# Patient Record
Sex: Female | Born: 1945
Health system: Southern US, Community
[De-identification: ages and names within clinical notes are randomized; demographics above are authoritative.]

## PROBLEM LIST (undated history)

## (undated) DIAGNOSIS — R519 Headache, unspecified: Secondary | ICD-10-CM

## (undated) DIAGNOSIS — J449 Chronic obstructive pulmonary disease, unspecified: Secondary | ICD-10-CM

## (undated) DIAGNOSIS — K219 Gastro-esophageal reflux disease without esophagitis: Secondary | ICD-10-CM

## (undated) DIAGNOSIS — Z9889 Other specified postprocedural states: Secondary | ICD-10-CM

## (undated) DIAGNOSIS — I671 Cerebral aneurysm, nonruptured: Secondary | ICD-10-CM

## (undated) DIAGNOSIS — A059 Bacterial foodborne intoxication, unspecified: Secondary | ICD-10-CM

## (undated) DIAGNOSIS — M1712 Unilateral primary osteoarthritis, left knee: Secondary | ICD-10-CM

## (undated) DIAGNOSIS — I10 Essential (primary) hypertension: Secondary | ICD-10-CM

## (undated) DIAGNOSIS — K7460 Unspecified cirrhosis of liver: Secondary | ICD-10-CM

## (undated) DIAGNOSIS — E785 Hyperlipidemia, unspecified: Secondary | ICD-10-CM

## (undated) HISTORY — DX: Cerebral aneurysm, nonruptured: I67.1

## (undated) HISTORY — PX: CHOLECYSTECTOMY: SHX55

## (undated) HISTORY — DX: Other specified postprocedural states: Z98.890

## (undated) HISTORY — PX: ABDOMINAL HYSTERECTOMY: SHX81

## (undated) HISTORY — DX: Unspecified cirrhosis of liver: K74.60

## (undated) HISTORY — DX: Essential (primary) hypertension: I10

## (undated) HISTORY — DX: Hyperlipidemia, unspecified: E78.5

## (undated) HISTORY — DX: Chronic obstructive pulmonary disease, unspecified: J44.9

## (undated) HISTORY — PX: OTHER SURGICAL HISTORY: SHX169

---
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
## 1999-03-05 ENCOUNTER — Encounter (INDEPENDENT_AMBULATORY_CARE_PROVIDER_SITE_OTHER): Payer: Self-pay | Admitting: Specialist

## 1999-03-05 ENCOUNTER — Other Ambulatory Visit: Admission: RE | Admit: 1999-03-05 | Discharge: 1999-03-05 | Payer: Self-pay | Admitting: Plastic Surgery

## 2008-02-28 ENCOUNTER — Ambulatory Visit: Payer: Self-pay | Admitting: Cardiology

## 2008-02-29 ENCOUNTER — Observation Stay (HOSPITAL_COMMUNITY): Admission: AD | Admit: 2008-02-29 | Discharge: 2008-03-01 | Payer: Self-pay | Admitting: Cardiology

## 2008-02-29 ENCOUNTER — Ambulatory Visit: Payer: Self-pay | Admitting: Cardiovascular Disease

## 2010-11-11 NOTE — Discharge Summary (Signed)
Jill Braun, Jill Braun                ACCOUNT NO.:  [id number]   MEDICAL RECORD NO.:  [id number]          PATIENT TYPE:  INP   LOCATION:  4703                         FACILITY:  [facility]   PHYSICIAN:  Veverly Fells. Excell Seltzer, MD  DATE OF BIRTH:  10/23/1945   DATE OF ADMISSION:  02/29/2008  DATE OF DISCHARGE:  03/01/2008                               DISCHARGE SUMMARY   PRIMARY CARDIOLOGIST:  Learta Codding, MD,FACC at [facility].   PROCEDURES PERFORMED DURING HOSPITALIZATION:  1. Cardiac catheterization.      a.     Normal coronary anatomy and normal left ventricular function       per Dr. Tonny Bollman on February 29, 2008.   FINAL DISCHARGE DIAGNOSES:  1. Noncardiac chest pain.  2. Hypercholesterolemia.  3. Ongoing tobacco abuse.  4. History of chronic obstructive pulmonary disease.  5. Gastroesophageal reflux disease.   HOSPITAL COURSE:  This is a 65 year old Caucasian female, who was  originally seen by Dr. Andee Lineman at [facility] on consultation  secondary to chest pain.  The patient was admitted secondary to this and  has been followed by Dr. Sherril Croon.  The patient was seen and examined in  [facility] by Dr. Andee Lineman.  He fell with multiple cardiovascular risk  factors and complaints of chest discomfort that she would benefit from  cardiac catheterization.  The patient was transferred to [facility] and did undergo cardiac catheterization on February 29, 2008.  Cardiac catheterization was completed by Dr. Tonny Bollman and she was  found to be normal with normal coronary anatomy, normal LV function with  an EF of 60%.  The patient recovered well without complaints of any  further discomfort in her chest.  It was noted that she had a transient  episode of SVT lasting less than 10 seconds during the early morning  hours prior to discharge.  She was asymptomatic with this.  The patient  is not a candidate for beta-blocker therapy secondary to hypotension.   The patient was seen and  examined by Dr. Tonny Bollman on day of  discharge and the patient was found to be stable.  The patient will not  be sent home on a statin at this time per Dr. Excell Seltzer as her LDL is okay  and she had normal coronaries.  The patient had also been discussed the  importance of tobacco cessation and she verbalized understanding.  The  patient will follow up with Dr. Olga Millers in [place] in the absence of  Dr. Andee Lineman for postcath evaluation and continuation of the cardiac  monitoring with referral to pulmonologist with history of COPD at his  discussion.   DISCHARGE LABS:  Total cholesterol 160, triglycerides 156, HDL 26, LDL  103.  Cardiac enzymes were cycled in [place] and found to be negative  x3.  The patient's hemoglobin was 14.8, hematocrit 43, white blood cells  9.7, platelets 220.  Sodium 139, potassium 4.4, BUN 12, creatinine 0.8,  glucose 113.  EKG on discharge from [facility] revealed normal sinus  rhythm with no acute ST-T wave changes.   DISCHARGE  MEDICATIONS:  1. Fish oil daily.  2. Nexium 40 mg daily.  3. Multivitamin daily.  4. Calcium D 500 mg daily.  5. Unasyn p.r.n.   ALLERGIES:  No known drug allergies.   FOLLOWUP PLANS AND APPOINTMENT:  1. The patient is to follow up with Dr. Olga Millers in the absence      of Dr. Andee Lineman on Friday, March 16, 2008, at 9:30 a.m.  2. The patient is to follow up with her primary care physician Dr.      Sherril Croon on her own report for continued medical management.  3. The patient has been given postcardiac catheterization instructions      with particular emphasis on the right groin site for evidence of      bleeding, hematoma, or signs of infection.  Time spent with the      patient to include physician time 35 minutes.      Bettey Mare. Lyman Bishop, NP      Veverly Fells. Excell Seltzer, MD  Electronically Signed    KML/MEDQ  D:  03/01/2008  T:  03/02/2008  Job:  [id number]   cc:   Dr. Sherril Croon

## 2010-11-11 NOTE — Cardiovascular Report (Signed)
Jill Braun, Jill Braun                ACCOUNT NO.:  [id number]   MEDICAL RECORD NO.:  [id number]          PATIENT TYPE:  INP   LOCATION:  4703                         FACILITY:  [facility]   PHYSICIAN:  Veverly Fells. Excell Seltzer, MD  DATE OF BIRTH:  1945-12-22   DATE OF PROCEDURE:  DATE OF DISCHARGE:                            CARDIAC CATHETERIZATION   PROCEDURE:  Left heart catheterization, selective coronary angiography,  and left ventricular angiography.   INDICATIONS:  Ms. Bordenave is a 65 year old woman who was evaluated at  [facility] for chest, arm, and neck pain.  She has multiple  cardiac risk factors and in the setting of high pretest probability, was  referred for cardiac catheterization.    Risks and indications of the procedure were reviewed with the patient.  Informed consent was obtained.  The right groin was prepped, draped, and  anesthetized with 1% lidocaine using modified Seldinger technique.  A 5-  French sheath was placed in the right femoral artery.  Standard 5-French  Judkins catheters was used for coronary angiography and left  ventriculography.  All catheter exchanges were performed over guidewire.  The patient tolerated the procedure well.  There were no immediate  complications.   FINDINGS:  Aortic pressure 112/64 with a mean of 85, left ventricular  pressure 109/13.   Left mainstem:  Angiographically normal.  Trifurcates into LAD, left  circumflex, and ramus intermedius.   LAD:  Moderate-sized vessel that courses down to the LV apex and the  midportion of the LAD.  The vessel divides into a twin LAD system.  There is a high first diagonal branch that is widely patent.  There is  no significant stenosis throughout the LAD or its branch vessels.   Intermediate branch moderate-sized vessel, tortuous in the midportion,  widely patent with no stenosis.   Left circumflex single OM branch AV groove circumflex is tiny beyond the  OM.  No stenosis.   Right coronary  artery:  The RCA is dominant.  It is a large vessel.  It  supplies a small RV marginal branch, large acute marginal branch.  A  single PDA and two posterolateral branches.  There is no significant  stenosis throughout the right coronary artery or its branch vessels.   Left ventriculography:  LV function is normal.  The LVEF is estimated at  65%.  There is no mitral regurgitation.   ASSESSMENT:  1. Normal coronary arteries.  2. Normal left ventricular function.  3. Normal left ventricular filling pressure.   RECOMMENDATIONS:  Ms. Schweers symptoms are likely noncardiac.  Further  outpatient evaluation if symptoms persist.      Veverly Fells. Excell Seltzer, MD  Electronically Signed     MDC/MEDQ  D:  02/29/2008  T:  03/01/2008  Job:  [id number]   cc:   Doreen Beam, MD

## 2011-04-01 LAB — LIPID PANEL
Total CHOL/HDL Ratio: 6.2
VLDL: 31

## 2012-01-07 ENCOUNTER — Encounter: Payer: Self-pay | Admitting: Physician Assistant

## 2012-01-07 DIAGNOSIS — R079 Chest pain, unspecified: Secondary | ICD-10-CM

## 2014-01-29 ENCOUNTER — Encounter (HOSPITAL_COMMUNITY): Payer: Self-pay

## 2014-01-29 ENCOUNTER — Encounter (HOSPITAL_COMMUNITY)
Admission: RE | Admit: 2014-01-29 | Discharge: 2014-01-29 | Disposition: A | Discharge status: Home / Self Care | Payer: Medicare Other | Source: Ambulatory Visit | Attending: Ophthalmology | Admitting: Ophthalmology

## 2014-01-29 ENCOUNTER — Encounter (HOSPITAL_COMMUNITY): Payer: Self-pay | Admitting: Pharmacy Technician

## 2014-01-29 DIAGNOSIS — H2510 Age-related nuclear cataract, unspecified eye: Secondary | ICD-10-CM | POA: Diagnosis not present

## 2014-01-29 HISTORY — DX: Gastro-esophageal reflux disease without esophagitis: K21.9

## 2014-01-29 NOTE — Patient Instructions (Addendum)
Your procedure is scheduled on:  02/01/2014  Report to [facility] at  11:20    AM.  Call this number if you have problems the morning of surgery: [phone]   Remember:   Do not eat or drink :After Midnight.    Take these medicines the morning of surgery with A SIP OF WATER: Nexium   Do not wear jewelry, make-up or nail polish.  Do not wear lotions, powders, or perfumes. You may wear deodorant.  Do not shave 48 hours prior to surgery.  Do not bring valuables to the hospital.  Contacts, dentures or bridgework may not be worn into surgery.  Patients discharged the day of surgery will not be allowed to drive home.  Name and phone number of your driver:    Please read over the following fact sheets that you were given: Pain Booklet, Surgical Site Infection Prevention, Anesthesia Post-op Instructions and Care and Recovery After Surgery  Cataract Surgery  A cataract is a clouding of the lens of the eye. When a lens becomes cloudy, vision is reduced based on the degree and nature of the clouding. Surgery may be needed to improve vision. Surgery removes the cloudy lens and usually replaces it with a substitute lens (intraocular lens, IOL). LET YOUR EYE DOCTOR KNOW ABOUT:  Allergies to food or medicine.   Medicines taken including herbs, eyedrops, over-the-counter medicines, and creams.   Use of steroids (by mouth or creams).   Previous problems with anesthetics or numbing medicine.   History of bleeding problems or blood clots.   Previous surgery.   Other health problems, including diabetes and kidney problems.   Possibility of pregnancy, if this applies.  RISKS AND COMPLICATIONS  Infection.   Inflammation of the eyeball (endophthalmitis) that can spread to both eyes (sympathetic ophthalmia).   Poor wound healing.   If an IOL is inserted, it can later fall out of proper position. This is very uncommon.   Clouding of the part of your eye that holds an IOL in place. This is  called an "after-cataract." These are uncommon, but easily treated.  BEFORE THE PROCEDURE  Do not eat or drink anything except small amounts of water for 8 to 12 before your surgery, or as directed by your caregiver.   Unless you are told otherwise, continue any eyedrops you have been prescribed.   Talk to your primary caregiver about all other medicines that you take (both prescription and non-prescription). In some cases, you may need to stop or change medicines near the time of your surgery. This is most important if you are taking blood-thinning medicine.Do not stop medicines unless you are told to do so.   Arrange for someone to drive you to and from the procedure.   Do not put contact lenses in either eye on the day of your surgery.  PROCEDURE There is more than one method for safely removing a cataract. Your doctor can explain the differences and help determine which is best for you. Phacoemulsification surgery is the most common form of cataract surgery.  An injection is given behind the eye or eyedrops are given to make this a painless procedure.   A small cut (incision) is made on the edge of the clear, dome-shaped surface that covers the front of the eye (cornea).   A tiny probe is painlessly inserted into the eye. This device gives off ultrasound waves that soften and break up the cloudy center of the lens. This makes it easier for  the cloudy lens to be removed by suction.   An IOL may be implanted.   The normal lens of the eye is covered by a clear capsule. Part of that capsule is intentionally left in the eye to support the IOL.   Your surgeon may or may not use stitches to close the incision.  There are other forms of cataract surgery that require a larger incision and stiches to close the eye. This approach is taken in cases where the doctor feels that the cataract cannot be easily removed using phacoemulsification. AFTER THE PROCEDURE  When an IOL is implanted, it  does not need care. It becomes a permanent part of your eye and cannot be seen or felt.   Your doctor will schedule follow-up exams to check on your progress.   Review your other medicines with your doctor to see which can be resumed after surgery.   Use eyedrops or take medicine as prescribed by your doctor.  Document Released: 06/04/2011 Document Reviewed: 06/01/2011 [facility] Patient Information 2012 [place].  .Cataract Surgery Care After Refer to this sheet in the next few weeks. These instructions provide you with information on caring for yourself after your procedure. Your caregiver may also give you more specific instructions. Your treatment has been planned according to current medical practices, but problems sometimes occur. Call your caregiver if you have any problems or questions after your procedure.  HOME CARE INSTRUCTIONS   Avoid strenuous activities as directed by your caregiver.   Ask your caregiver when you can resume driving.   Use eyedrops or other medicines to help healing and control pressure inside your eye as directed by your caregiver.   Only take over-the-counter or prescription medicines for pain, discomfort, or fever as directed by your caregiver.   Do not to touch or rub your eyes.   You may be instructed to use a protective shield during the first few days and nights after surgery. If not, wear sunglasses to protect your eyes. This is to protect the eye from pressure or from being accidentally bumped.   Keep the area around your eye clean and dry. Avoid swimming or allowing water to hit you directly in the face while showering. Keep soap and shampoo out of your eyes.   Do not bend or lift heavy objects. Bending increases pressure in the eye. You can walk, climb stairs, and do light household chores.   Do not put a contact lens into the eye that had surgery until your caregiver says it is okay to do so.   Ask your doctor when you can return to  work. This will depend on the kind of work that you do. If you work in a dusty environment, you may be advised to wear protective eyewear for a period of time.   Ask your caregiver when it will be safe to engage in sexual activity.   Continue with your regular eye exams as directed by your caregiver.  What to expect:  It is normal to feel itching and mild discomfort for a few days after cataract surgery. Some fluid discharge is also common, and your eye may be sensitive to light and touch.   After 1 to 2 days, even moderate discomfort should disappear. In most cases, healing will take about 6 weeks.   If you received an intraocular lens (IOL), you may notice that colors are very bright or have a blue tinge. Also, if you have been in bright sunlight, everything may appear  reddish for a few hours. If you see these color tinges, it is because your lens is clear and no longer cloudy. Within a few months after receiving an IOL, these extra colors should go away. When you have healed, you will probably need new glasses.  SEEK MEDICAL CARE IF:   You have increased bruising around your eye.   You have discomfort not helped by medicine.  SEEK IMMEDIATE MEDICAL CARE IF:   You have a fever.   You have a worsening or sudden vision loss.   You have redness, swelling, or increasing pain in the eye.   You have a thick discharge from the eye that had surgery.  MAKE SURE YOU:  Understand these instructions.   Will watch your condition.   Will get help right away if you are not doing well or get worse.  Document Released: 01/02/2005 Document Revised: 06/04/2011 Document Reviewed: 02/06/2011 [facility] Patient Information 2012 [place].    Monitored Anesthesia Care  Monitored anesthesia care is an anesthesia service for a medical procedure. Anesthesia is the loss of the ability to feel pain. It is produced by medications called anesthetics. It may affect a small area of your body (local  anesthesia), a large area of your body (regional anesthesia), or your entire body (general anesthesia). The need for monitored anesthesia care depends your procedure, your condition, and the potential need for regional or general anesthesia. It is often provided during procedures where:   General anesthesia may be needed if there are complications. This is because you need special care when you are under general anesthesia.   You will be under local or regional anesthesia. This is so that you are able to have higher levels of anesthesia if needed.   You will receive calming medications (sedatives). This is especially the case if sedatives are given to put you in a semi-conscious state of relaxation (deep sedation). This is because the amount of sedative needed to produce this state can be hard to predict. Too much of a sedative can produce general anesthesia. Monitored anesthesia care is performed by one or more caregivers who have special training in all types of anesthesia. You will need to meet with these caregivers before your procedure. During this meeting, they will ask you about your medical history. They will also give you instructions to follow. (For example, you will need to stop eating and drinking before your procedure. You may also need to stop or change medications you are taking.) During your procedure, your caregivers will stay with you. They will:   Watch your condition. This includes watching you blood pressure, breathing, and level of pain.   Diagnose and treat problems that occur.   Give medications if they are needed. These may include calming medications (sedatives) and anesthetics.   Make sure you are comfortable.  Having monitored anesthesia care does not necessarily mean that you will be under anesthesia. It does mean that your caregivers will be able to manage anesthesia if you need it or if it occurs. It also means that you will be able to have a different type of  anesthesia than you are having if you need it. When your procedure is complete, your caregivers will continue to watch your condition. They will make sure any medications wear off before you are allowed to go home.  Document Released: 03/11/2005 Document Revised: 10/10/2012 Document Reviewed: 07/27/2012 [facility] Patient Information 2014 [place], [place].

## 2014-02-01 ENCOUNTER — Encounter (HOSPITAL_COMMUNITY): Payer: Self-pay | Admitting: Ophthalmology

## 2014-02-01 ENCOUNTER — Encounter (HOSPITAL_COMMUNITY)
Admission: RE | Disposition: A | Discharge status: Home / Self Care | Payer: Self-pay | Source: Ambulatory Visit | Attending: Ophthalmology

## 2014-02-01 ENCOUNTER — Encounter (HOSPITAL_COMMUNITY): Payer: Medicare Other | Admitting: Anesthesiology

## 2014-02-01 ENCOUNTER — Ambulatory Visit (HOSPITAL_COMMUNITY)
Admission: RE | Admit: 2014-02-01 | Discharge: 2014-02-01 | Disposition: A | Discharge status: Home / Self Care | Payer: Medicare Other | Source: Ambulatory Visit | Attending: Ophthalmology | Admitting: Ophthalmology

## 2014-02-01 ENCOUNTER — Ambulatory Visit (HOSPITAL_COMMUNITY): Payer: Medicare Other | Admitting: Anesthesiology

## 2014-02-01 DIAGNOSIS — H2510 Age-related nuclear cataract, unspecified eye: Secondary | ICD-10-CM | POA: Insufficient documentation

## 2014-02-01 HISTORY — PX: CATARACT EXTRACTION W/PHACO: SHX586

## 2014-02-01 SURGERY — PHACOEMULSIFICATION, CATARACT, WITH IOL INSERTION
Anesthesia: Monitor Anesthesia Care | Site: Eye | Laterality: Right

## 2014-02-01 MED ORDER — NEOMYCIN-POLYMYXIN-DEXAMETH 3.5-10000-0.1 OP SUSP
OPHTHALMIC | Status: DC | PRN
  Administered 2014-02-01: 2 [drp] via OPHTHALMIC

## 2014-02-01 MED ORDER — TETRACAINE HCL 0.5 % OP SOLN
1.0000 [drp] | OPHTHALMIC | Status: AC
  Administered 2014-02-01 (×3): 1 [drp] via OPHTHALMIC

## 2014-02-01 MED ORDER — LIDOCAINE HCL 3.5 % OP GEL
1.0000 "application " | Freq: Once | OPHTHALMIC | Status: AC
  Administered 2014-02-01: 1 via OPHTHALMIC

## 2014-02-01 MED ORDER — MIDAZOLAM HCL 2 MG/2ML IJ SOLN
INTRAMUSCULAR | Status: AC
  Filled 2014-02-01: qty 2

## 2014-02-01 MED ORDER — LIDOCAINE HCL (PF) 1 % IJ SOLN
INTRAMUSCULAR | Status: DC | PRN
  Administered 2014-02-01: .7 mL

## 2014-02-01 MED ORDER — MIDAZOLAM HCL 2 MG/2ML IJ SOLN
1.0000 mg | INTRAMUSCULAR | Status: DC | PRN
  Administered 2014-02-01: 2 mg via INTRAVENOUS

## 2014-02-01 MED ORDER — LACTATED RINGERS IV SOLN
INTRAVENOUS | Status: DC
  Administered 2014-02-01: 12:00:00 via INTRAVENOUS

## 2014-02-01 MED ORDER — FENTANYL CITRATE 0.05 MG/ML IJ SOLN
25.0000 ug | INTRAMUSCULAR | Status: AC
  Administered 2014-02-01 (×2): 25 ug via INTRAVENOUS

## 2014-02-01 MED ORDER — BSS IO SOLN
INTRAOCULAR | Status: DC | PRN
  Administered 2014-02-01: 15 mL via INTRAOCULAR

## 2014-02-01 MED ORDER — PROVISC 10 MG/ML IO SOLN
INTRAOCULAR | Status: DC | PRN
  Administered 2014-02-01: 0.85 mL via INTRAOCULAR

## 2014-02-01 MED ORDER — CYCLOPENTOLATE-PHENYLEPHRINE 0.2-1 % OP SOLN
1.0000 [drp] | OPHTHALMIC | Status: AC
  Administered 2014-02-01 (×3): 1 [drp] via OPHTHALMIC

## 2014-02-01 MED ORDER — POVIDONE-IODINE 5 % OP SOLN
OPHTHALMIC | Status: DC | PRN
  Administered 2014-02-01: 1 via OPHTHALMIC

## 2014-02-01 MED ORDER — PHENYLEPHRINE HCL 2.5 % OP SOLN
1.0000 [drp] | OPHTHALMIC | Status: AC
  Administered 2014-02-01 (×3): 1 [drp] via OPHTHALMIC

## 2014-02-01 MED ORDER — EPINEPHRINE HCL 1 MG/ML IJ SOLN
INTRAOCULAR | Status: DC | PRN
  Administered 2014-02-01: 12:00:00

## 2014-02-01 MED ORDER — ONDANSETRON HCL 4 MG/2ML IJ SOLN: 4.0000 mg | Freq: Once | INTRAMUSCULAR | Status: DC | PRN

## 2014-02-01 MED ORDER — FENTANYL CITRATE 0.05 MG/ML IJ SOLN: 25.0000 ug | INTRAMUSCULAR | Status: DC | PRN

## 2014-02-01 MED ORDER — FENTANYL CITRATE 0.05 MG/ML IJ SOLN
INTRAMUSCULAR | Status: AC
  Filled 2014-02-01: qty 2

## 2014-02-01 MED ORDER — LIDOCAINE 3.5 % OP GEL OPTIME - NO CHARGE
OPHTHALMIC | Status: DC | PRN
  Administered 2014-02-01: 2 [drp] via OPHTHALMIC

## 2014-02-01 SURGICAL SUPPLY — 34 items
CAPSULAR TENSION RING-AMO (OPHTHALMIC RELATED) IMPLANT
CLOTH BEACON ORANGE TIMEOUT ST (SAFETY) ×2 IMPLANT
EYE SHIELD UNIVERSAL CLEAR (GAUZE/BANDAGES/DRESSINGS) ×2 IMPLANT
GLOVE BIO SURGEON STRL SZ 6.5 (GLOVE) IMPLANT
GLOVE BIO SURGEONS STRL SZ 6.5 (GLOVE)
GLOVE BIOGEL PI IND STRL 6.5 (GLOVE) IMPLANT
GLOVE BIOGEL PI IND STRL 7.0 (GLOVE) IMPLANT
GLOVE BIOGEL PI IND STRL 7.5 (GLOVE) IMPLANT
GLOVE BIOGEL PI INDICATOR 6.5 (GLOVE)
GLOVE BIOGEL PI INDICATOR 7.0 (GLOVE) ×2
GLOVE BIOGEL PI INDICATOR 7.5 (GLOVE)
GLOVE ECLIPSE 6.5 STRL STRAW (GLOVE) IMPLANT
GLOVE ECLIPSE 7.0 STRL STRAW (GLOVE) IMPLANT
GLOVE ECLIPSE 7.5 STRL STRAW (GLOVE) IMPLANT
GLOVE EXAM NITRILE LRG STRL (GLOVE) IMPLANT
GLOVE EXAM NITRILE MD LF STRL (GLOVE) ×2 IMPLANT
GLOVE SKINSENSE NS SZ6.5 (GLOVE)
GLOVE SKINSENSE NS SZ7.0 (GLOVE)
GLOVE SKINSENSE STRL SZ6.5 (GLOVE) IMPLANT
GLOVE SKINSENSE STRL SZ7.0 (GLOVE) IMPLANT
KIT VITRECTOMY (OPHTHALMIC RELATED) IMPLANT
PAD ARMBOARD 7.5X6 YLW CONV (MISCELLANEOUS) ×2 IMPLANT
PROC W NO LENS (INTRAOCULAR LENS)
PROC W SPEC LENS (INTRAOCULAR LENS)
PROCESS W NO LENS (INTRAOCULAR LENS) IMPLANT
PROCESS W SPEC LENS (INTRAOCULAR LENS) IMPLANT
RETRACTOR IRIS SIGHTPATH (OPHTHALMIC RELATED) ×5 IMPLANT
RING MALYGIN (MISCELLANEOUS) IMPLANT
SIGHTPATH CAT PROC W REG LENS (Ophthalmic Related) ×3 IMPLANT
SYRINGE LUER LOK 1CC (MISCELLANEOUS) ×2 IMPLANT
TAPE SURG TRANSPORE 1 IN (GAUZE/BANDAGES/DRESSINGS) IMPLANT
TAPE SURGICAL TRANSPORE 1 IN (GAUZE/BANDAGES/DRESSINGS) ×2
VISCOELASTIC ADDITIONAL (OPHTHALMIC RELATED) IMPLANT
WATER STERILE IRR 250ML POUR (IV SOLUTION) ×2 IMPLANT

## 2014-02-01 NOTE — Transfer of Care (Signed)
Immediate Anesthesia Transfer of Care Note  Patient: Jill Braun  Procedure(s) Performed: Procedure(s) (LRB): CATARACT EXTRACTION PHACO AND INTRAOCULAR LENS PLACEMENT (IOC) (Right)  Patient Location: Shortstay  Anesthesia Type: MAC  Level of Consciousness: awake  Airway & Oxygen Therapy: Patient Spontanous Breathing   Post-op Assessment: Report given to PACU RN, Post -op Vital signs reviewed and stable and Patient moving all extremities  Post vital signs: Reviewed and stable  Complications: No apparent anesthesia complications

## 2014-02-01 NOTE — Anesthesia Preprocedure Evaluation (Signed)
Anesthesia Evaluation  Patient identified by MRN, date of birth, ID band Patient awake    Reviewed: Allergy & Precautions, H&P , NPO status , Patient's Chart, lab work & pertinent test results  Airway Mallampati: II TM Distance: >3 FB     Dental  (+) Implants   Pulmonary Current Smoker,  breath sounds clear to auscultation        Cardiovascular negative cardio ROS  Rhythm:Regular Rate:Normal     Neuro/Psych    GI/Hepatic GERD-  Controlled and Medicated,  Endo/Other    Renal/GU      Musculoskeletal   Abdominal   Peds  Hematology   Anesthesia Other Findings   Reproductive/Obstetrics                           Anesthesia Physical Anesthesia Plan  ASA: II  Anesthesia Plan: MAC   Post-op Pain Management:    Induction: Intravenous  Airway Management Planned: Nasal Cannula  Additional Equipment:   Intra-op Plan:   Post-operative Plan:   Informed Consent: I have reviewed the patients History and Physical, chart, labs and discussed the procedure including the risks, benefits and alternatives for the proposed anesthesia with the patient or authorized representative who has indicated his/her understanding and acceptance.     Plan Discussed with:   Anesthesia Plan Comments:         Anesthesia Quick Evaluation

## 2014-02-01 NOTE — Addendum Note (Signed)
Addendum created 02/01/14 1304 by Vista Deck, CRNA   Modules edited: Charges VN

## 2014-02-01 NOTE — Anesthesia Postprocedure Evaluation (Signed)
  Anesthesia Post-op Note  Patient: Jill Braun  Procedure(s) Performed: Procedure(s) (LRB): CATARACT EXTRACTION PHACO AND INTRAOCULAR LENS PLACEMENT (IOC) (Right)  Patient Location:  Short Stay  Anesthesia Type: MAC  Level of Consciousness: awake  Airway and Oxygen Therapy: Patient Spontanous Breathing  Post-op Pain: none  Post-op Assessment: Post-op Vital signs reviewed, Patient's Cardiovascular Status Stable, Respiratory Function Stable, Patent Airway, No signs of Nausea or vomiting and Pain level controlled  Post-op Vital Signs: Reviewed and stable  Complications: No apparent anesthesia complications

## 2014-02-01 NOTE — H&P (Signed)
I have reviewed the H&P, the patient was re-examined, and I have identified no interval changes in medical condition and plan of care since the history and physical of record  

## 2014-02-01 NOTE — Op Note (Signed)
Date of Admission: 02/01/2014  Date of Surgery: 02/01/2014   Pre-Op Dx: Cataract Right Eye  Post-Op Dx: Senile Nuclear  Cataract Right  Eye,  Dx Code 366.16  Surgeon: Tonny Branch, M.D.  Assistants: None  Anesthesia: Topical with MAC  Indications: Painless, progressive loss of vision with compromise of daily activities.  Surgery: Cataract Extraction with Intraocular lens Implant Right Eye  Discription: The patient had dilating drops and viscous lidocaine placed into the Right eye in the pre-op holding area. After transfer to the operating room, a time out was performed. The patient was then prepped and draped. Beginning with a 86 degree blade a paracentesis port was made at the surgeon's 2 o'clock position. The anterior chamber was then filled with 1% non-preserved lidocaine. This was followed by filling the anterior chamber with Provisc.  A 2.37mm keratome blade was used to make a clear corneal incision at the temporal limbus.  A bent cystatome needle was used to create a continuous tear capsulotomy. Hydrodissection was performed with balanced salt solution on a Fine canula. The lens nucleus was then removed using the phacoemulsification handpiece. Residual cortex was removed with the I&A handpiece. The anterior chamber and capsular bag were refilled with Provisc. A posterior chamber intraocular lens was placed into the capsular bag with it's injector. The implant was positioned with the Kuglan hook. The Provisc was then removed from the anterior chamber and capsular bag with the I&A handpiece. Stromal hydration of the main incision and paracentesis port was performed with BSS on a Fine canula. The wounds were tested for leak which was negative. The patient tolerated the procedure well. There were no operative complications. The patient was then transferred to the recovery room in stable condition.  Complications: None  Specimen: None  EBL: None  Prosthetic device: Hoya iSert 250, power 22.5 D,  SN [id number].

## 2014-02-01 NOTE — Anesthesia Procedure Notes (Signed)
Procedure Name: MAC Date/Time: 02/01/2014 12:16 PM Performed by: Vista Deck Pre-anesthesia Checklist: Patient identified, Emergency Drugs available, Suction available, Timeout performed and Patient being monitored Patient Re-evaluated:Patient Re-evaluated prior to inductionOxygen Delivery Method: Nasal Cannula

## 2014-02-02 ENCOUNTER — Encounter (HOSPITAL_COMMUNITY): Payer: Self-pay | Admitting: Ophthalmology

## 2014-02-15 ENCOUNTER — Encounter (HOSPITAL_COMMUNITY): Payer: Self-pay | Admitting: Pharmacy Technician

## 2014-02-20 ENCOUNTER — Encounter (HOSPITAL_COMMUNITY): Payer: Self-pay

## 2014-02-20 ENCOUNTER — Encounter (HOSPITAL_COMMUNITY)
Admission: RE | Admit: 2014-02-20 | Discharge: 2014-02-20 | Disposition: A | Discharge status: Home / Self Care | Payer: Medicare Other | Source: Ambulatory Visit | Attending: Ophthalmology | Admitting: Ophthalmology

## 2014-02-20 NOTE — Pre-Procedure Instructions (Signed)
History verified and no changes noted since previous assessment.  Advised she may take Nexium that am with small sip of water.  Verbalized understanding of instructions, along with date and time of arrival.

## 2014-02-21 MED ORDER — LIDOCAINE HCL (PF) 1 % IJ SOLN
INTRAMUSCULAR | Status: AC
  Filled 2014-02-21: qty 2

## 2014-02-21 MED ORDER — NEOMYCIN-POLYMYXIN-DEXAMETH 3.5-10000-0.1 OP SUSP
OPHTHALMIC | Status: AC
  Filled 2014-02-21: qty 5

## 2014-02-21 MED ORDER — LIDOCAINE HCL 3.5 % OP GEL
OPHTHALMIC | Status: AC
  Filled 2014-02-21: qty 1

## 2014-02-21 MED ORDER — TETRACAINE HCL 0.5 % OP SOLN
OPHTHALMIC | Status: AC
  Filled 2014-02-21: qty 2

## 2014-02-21 MED ORDER — CYCLOPENTOLATE-PHENYLEPHRINE OP SOLN OPTIME - NO CHARGE
OPHTHALMIC | Status: AC
  Filled 2014-02-21: qty 2

## 2014-02-21 MED ORDER — PHENYLEPHRINE HCL 2.5 % OP SOLN
OPHTHALMIC | Status: AC
  Filled 2014-02-21: qty 15

## 2014-02-22 ENCOUNTER — Encounter (HOSPITAL_COMMUNITY)
Admission: RE | Disposition: A | Discharge status: Home / Self Care | Payer: Self-pay | Source: Ambulatory Visit | Attending: Ophthalmology

## 2014-02-22 ENCOUNTER — Ambulatory Visit (HOSPITAL_COMMUNITY)
Admission: RE | Admit: 2014-02-22 | Discharge: 2014-02-22 | Disposition: A | Discharge status: Home / Self Care | Payer: Medicare Other | Source: Ambulatory Visit | Attending: Ophthalmology | Admitting: Ophthalmology

## 2014-02-22 ENCOUNTER — Ambulatory Visit (HOSPITAL_COMMUNITY): Payer: Medicare Other | Admitting: Anesthesiology

## 2014-02-22 ENCOUNTER — Encounter (HOSPITAL_COMMUNITY): Payer: Medicare Other | Admitting: Anesthesiology

## 2014-02-22 ENCOUNTER — Encounter (HOSPITAL_COMMUNITY): Payer: Self-pay | Admitting: *Deleted

## 2014-02-22 DIAGNOSIS — H2510 Age-related nuclear cataract, unspecified eye: Secondary | ICD-10-CM | POA: Diagnosis present

## 2014-02-22 DIAGNOSIS — F17200 Nicotine dependence, unspecified, uncomplicated: Secondary | ICD-10-CM | POA: Insufficient documentation

## 2014-02-22 DIAGNOSIS — K219 Gastro-esophageal reflux disease without esophagitis: Secondary | ICD-10-CM | POA: Diagnosis not present

## 2014-02-22 DIAGNOSIS — Z79899 Other long term (current) drug therapy: Secondary | ICD-10-CM | POA: Insufficient documentation

## 2014-02-22 HISTORY — PX: CATARACT EXTRACTION W/PHACO: SHX586

## 2014-02-22 SURGERY — PHACOEMULSIFICATION, CATARACT, WITH IOL INSERTION
Anesthesia: Monitor Anesthesia Care | Site: Eye | Laterality: Left

## 2014-02-22 MED ORDER — PHENYLEPHRINE HCL 2.5 % OP SOLN
1.0000 [drp] | OPHTHALMIC | Status: AC
  Administered 2014-02-22 (×3): 1 [drp] via OPHTHALMIC

## 2014-02-22 MED ORDER — MIDAZOLAM HCL 2 MG/2ML IJ SOLN
INTRAMUSCULAR | Status: AC
  Filled 2014-02-22: qty 2

## 2014-02-22 MED ORDER — LIDOCAINE HCL (PF) 1 % IJ SOLN
INTRAMUSCULAR | Status: DC | PRN
  Administered 2014-02-22: .4 mL

## 2014-02-22 MED ORDER — FENTANYL CITRATE 0.05 MG/ML IJ SOLN
25.0000 ug | INTRAMUSCULAR | Status: AC
  Administered 2014-02-22 (×2): 25 ug via INTRAVENOUS

## 2014-02-22 MED ORDER — BSS IO SOLN
INTRAOCULAR | Status: DC | PRN
  Administered 2014-02-22: 15 mL

## 2014-02-22 MED ORDER — EPINEPHRINE HCL 1 MG/ML IJ SOLN
INTRAMUSCULAR | Status: AC
  Filled 2014-02-22: qty 1

## 2014-02-22 MED ORDER — CYCLOPENTOLATE-PHENYLEPHRINE 0.2-1 % OP SOLN
1.0000 [drp] | OPHTHALMIC | Status: AC
  Administered 2014-02-22 (×3): 1 [drp] via OPHTHALMIC

## 2014-02-22 MED ORDER — LIDOCAINE HCL 3.5 % OP GEL
1.0000 "application " | Freq: Once | OPHTHALMIC | Status: AC
  Administered 2014-02-22: 1 via OPHTHALMIC

## 2014-02-22 MED ORDER — MIDAZOLAM HCL 2 MG/2ML IJ SOLN
1.0000 mg | INTRAMUSCULAR | Status: DC | PRN
  Administered 2014-02-22: 2 mg via INTRAVENOUS

## 2014-02-22 MED ORDER — PROVISC 10 MG/ML IO SOLN
INTRAOCULAR | Status: DC | PRN
  Administered 2014-02-22: 0.85 mL via INTRAOCULAR

## 2014-02-22 MED ORDER — FENTANYL CITRATE 0.05 MG/ML IJ SOLN
INTRAMUSCULAR | Status: AC
  Filled 2014-02-22: qty 2

## 2014-02-22 MED ORDER — LIDOCAINE 3.5 % OP GEL OPTIME - NO CHARGE
OPHTHALMIC | Status: DC | PRN
  Administered 2014-02-22: 1 [drp] via OPHTHALMIC

## 2014-02-22 MED ORDER — NEOMYCIN-POLYMYXIN-DEXAMETH 3.5-10000-0.1 OP SUSP
OPHTHALMIC | Status: DC | PRN
  Administered 2014-02-22: 2 [drp] via OPHTHALMIC

## 2014-02-22 MED ORDER — EPINEPHRINE HCL 1 MG/ML IJ SOLN
INTRAOCULAR | Status: DC | PRN
  Administered 2014-02-22: 11:00:00

## 2014-02-22 MED ORDER — POVIDONE-IODINE 5 % OP SOLN
OPHTHALMIC | Status: DC | PRN
  Administered 2014-02-22: 1 via OPHTHALMIC

## 2014-02-22 MED ORDER — LACTATED RINGERS IV SOLN
INTRAVENOUS | Status: DC
  Administered 2014-02-22: 10:00:00 via INTRAVENOUS

## 2014-02-22 MED ORDER — TETRACAINE HCL 0.5 % OP SOLN
1.0000 [drp] | OPHTHALMIC | Status: AC
  Administered 2014-02-22 (×3): 1 [drp] via OPHTHALMIC

## 2014-02-22 SURGICAL SUPPLY — 34 items
CAPSULAR TENSION RING-AMO (OPHTHALMIC RELATED) IMPLANT
CLOTH BEACON ORANGE TIMEOUT ST (SAFETY) ×2 IMPLANT
EYE SHIELD UNIVERSAL CLEAR (GAUZE/BANDAGES/DRESSINGS) ×2 IMPLANT
GLOVE BIO SURGEON STRL SZ 6.5 (GLOVE) IMPLANT
GLOVE BIO SURGEONS STRL SZ 6.5 (GLOVE)
GLOVE BIOGEL PI IND STRL 6.5 (GLOVE) IMPLANT
GLOVE BIOGEL PI IND STRL 7.0 (GLOVE) IMPLANT
GLOVE BIOGEL PI IND STRL 7.5 (GLOVE) IMPLANT
GLOVE BIOGEL PI INDICATOR 6.5 (GLOVE) ×2
GLOVE BIOGEL PI INDICATOR 7.0 (GLOVE) ×2
GLOVE BIOGEL PI INDICATOR 7.5 (GLOVE)
GLOVE ECLIPSE 6.5 STRL STRAW (GLOVE) IMPLANT
GLOVE ECLIPSE 7.0 STRL STRAW (GLOVE) IMPLANT
GLOVE ECLIPSE 7.5 STRL STRAW (GLOVE) IMPLANT
GLOVE EXAM NITRILE LRG STRL (GLOVE) IMPLANT
GLOVE EXAM NITRILE MD LF STRL (GLOVE) ×2 IMPLANT
GLOVE SKINSENSE NS SZ6.5 (GLOVE)
GLOVE SKINSENSE NS SZ7.0 (GLOVE)
GLOVE SKINSENSE STRL SZ6.5 (GLOVE) IMPLANT
GLOVE SKINSENSE STRL SZ7.0 (GLOVE) IMPLANT
KIT VITRECTOMY (OPHTHALMIC RELATED) IMPLANT
PAD ARMBOARD 7.5X6 YLW CONV (MISCELLANEOUS) ×2 IMPLANT
PROC W NO LENS (INTRAOCULAR LENS)
PROC W SPEC LENS (INTRAOCULAR LENS)
PROCESS W NO LENS (INTRAOCULAR LENS) IMPLANT
PROCESS W SPEC LENS (INTRAOCULAR LENS) IMPLANT
RETRACTOR IRIS SIGHTPATH (OPHTHALMIC RELATED) IMPLANT
RING MALYGIN (MISCELLANEOUS) IMPLANT
SIGHTPATH CAT PROC W REG LENS (Ophthalmic Related) ×3 IMPLANT
SYRINGE LUER LOK 1CC (MISCELLANEOUS) ×2 IMPLANT
TAPE SURG TRANSPORE 1 IN (GAUZE/BANDAGES/DRESSINGS) IMPLANT
TAPE SURGICAL TRANSPORE 1 IN (GAUZE/BANDAGES/DRESSINGS) ×2
VISCOELASTIC ADDITIONAL (OPHTHALMIC RELATED) IMPLANT
WATER STERILE IRR 250ML POUR (IV SOLUTION) ×2 IMPLANT

## 2014-02-22 NOTE — Anesthesia Postprocedure Evaluation (Signed)
  Anesthesia Post-op Note  Patient: Jill Braun  Procedure(s) Performed: Procedure(s) with comments: CATARACT EXTRACTION PHACO AND INTRAOCULAR LENS PLACEMENT (IOC) (Left) - CDE 8.12  Patient Location: Short Stay  Anesthesia Type:MAC  Level of Consciousness: awake, alert , oriented and patient cooperative  Airway and Oxygen Therapy: Patient Spontanous Breathing  Post-op Pain: none  Post-op Assessment: Post-op Vital signs reviewed, Patient's Cardiovascular Status Stable, Respiratory Function Stable, Patent Airway and Pain level controlled  Post-op Vital Signs: Reviewed and stable  Last Vitals:  Filed Vitals:   02/22/14 1040  BP: 125/62  Pulse:   Temp:   Resp: 25    Complications: No apparent anesthesia complications

## 2014-02-22 NOTE — Transfer of Care (Signed)
Immediate Anesthesia Transfer of Care Note  Patient: Jill Braun  Procedure(s) Performed: Procedure(s) with comments: CATARACT EXTRACTION PHACO AND INTRAOCULAR LENS PLACEMENT (IOC) (Left) - CDE 8.12  Patient Location: Short Stay  Anesthesia Type:MAC  Level of Consciousness: awake, alert , oriented and patient cooperative  Airway & Oxygen Therapy: Patient Spontanous Breathing  Post-op Assessment: Report given to PACU RN, Post -op Vital signs reviewed and stable and Patient moving all extremities  Post vital signs: Reviewed and stable  Complications: No apparent anesthesia complications

## 2014-02-22 NOTE — H&P (Signed)
I have reviewed the H&P, the patient was re-examined, and I have identified no interval changes in medical condition and plan of care since the history and physical of record  

## 2014-02-22 NOTE — Discharge Instructions (Signed)

## 2014-02-22 NOTE — Anesthesia Preprocedure Evaluation (Signed)
Anesthesia Evaluation  Patient identified by MRN, date of birth, ID band Patient awake    Reviewed: Allergy & Precautions, H&P , NPO status , Patient's Chart, lab work & pertinent test results  Airway Mallampati: II TM Distance: >3 FB     Dental  (+) Implants   Pulmonary Current Smoker,  breath sounds clear to auscultation        Cardiovascular negative cardio ROS  Rhythm:Regular Rate:Normal     Neuro/Psych    GI/Hepatic GERD-  Controlled and Medicated,  Endo/Other    Renal/GU      Musculoskeletal   Abdominal   Peds  Hematology   Anesthesia Other Findings   Reproductive/Obstetrics                           Anesthesia Physical Anesthesia Plan  ASA: II  Anesthesia Plan: MAC   Post-op Pain Management:    Induction: Intravenous  Airway Management Planned: Nasal Cannula  Additional Equipment:   Intra-op Plan:   Post-operative Plan:   Informed Consent: I have reviewed the patients History and Physical, chart, labs and discussed the procedure including the risks, benefits and alternatives for the proposed anesthesia with the patient or authorized representative who has indicated his/her understanding and acceptance.     Plan Discussed with:   Anesthesia Plan Comments:         Anesthesia Quick Evaluation

## 2014-02-22 NOTE — Op Note (Signed)
Date of Admission: 02/22/2014  Date of Surgery: 02/22/2014   Pre-Op Dx: Cataract Left Eye  Post-Op Dx: Senile Nuclear  Cataract Left  Eye,  Dx Code 366.16  Surgeon: Tonny Branch, M.D.  Assistants: None  Anesthesia: Topical with MAC  Indications: Painless, progressive loss of vision with compromise of daily activities.  Surgery: Cataract Extraction with Intraocular lens Implant Left Eye  Discription: The patient had dilating drops and viscous lidocaine placed into the Left eye in the pre-op holding area. After transfer to the operating room, a time out was performed. The patient was then prepped and draped. Beginning with a 27 degree blade a paracentesis port was made at the surgeon's 2 o'clock position. The anterior chamber was then filled with 1% non-preserved lidocaine. This was followed by filling the anterior chamber with Provisc.  A 2.64mm keratome blade was used to make a clear corneal incision at the temporal limbus.  A bent cystatome needle was used to create a continuous tear capsulotomy. Hydrodissection was performed with balanced salt solution on a Fine canula. The lens nucleus was then removed using the phacoemulsification handpiece. Residual cortex was removed with the I&A handpiece. The anterior chamber and capsular bag were refilled with Provisc. A posterior chamber intraocular lens was placed into the capsular bag with it's injector. The implant was positioned with the Kuglan hook. The Provisc was then removed from the anterior chamber and capsular bag with the I&A handpiece. Stromal hydration of the main incision and paracentesis port was performed with BSS on a Fine canula. The wounds were tested for leak which was negative. The patient tolerated the procedure well. There were no operative complications. The patient was then transferred to the recovery room in stable condition.  Complications: None  Specimen: None  EBL: None  Prosthetic device: Hoya iSert 250, power 21.5 D, SN  [id number].

## 2014-02-23 ENCOUNTER — Encounter (HOSPITAL_COMMUNITY): Payer: Self-pay | Admitting: Ophthalmology

## 2015-08-20 DIAGNOSIS — M1712 Unilateral primary osteoarthritis, left knee: Secondary | ICD-10-CM | POA: Diagnosis not present

## 2015-08-22 DIAGNOSIS — M7122 Synovial cyst of popliteal space [Baker], left knee: Secondary | ICD-10-CM | POA: Diagnosis not present

## 2015-08-27 DIAGNOSIS — M1712 Unilateral primary osteoarthritis, left knee: Secondary | ICD-10-CM | POA: Diagnosis not present

## 2015-09-10 DIAGNOSIS — M1712 Unilateral primary osteoarthritis, left knee: Secondary | ICD-10-CM | POA: Diagnosis not present

## 2015-10-22 DIAGNOSIS — H2513 Age-related nuclear cataract, bilateral: Secondary | ICD-10-CM | POA: Diagnosis not present

## 2015-10-22 DIAGNOSIS — H40033 Anatomical narrow angle, bilateral: Secondary | ICD-10-CM | POA: Diagnosis not present

## 2015-10-23 DIAGNOSIS — H26491 Other secondary cataract, right eye: Secondary | ICD-10-CM | POA: Diagnosis not present

## 2015-10-23 DIAGNOSIS — H26492 Other secondary cataract, left eye: Secondary | ICD-10-CM | POA: Diagnosis not present

## 2015-10-31 DIAGNOSIS — I10 Essential (primary) hypertension: Secondary | ICD-10-CM | POA: Diagnosis not present

## 2015-10-31 DIAGNOSIS — R5383 Other fatigue: Secondary | ICD-10-CM | POA: Diagnosis not present

## 2015-10-31 DIAGNOSIS — Z6833 Body mass index (BMI) 33.0-33.9, adult: Secondary | ICD-10-CM | POA: Diagnosis not present

## 2015-10-31 DIAGNOSIS — K589 Irritable bowel syndrome without diarrhea: Secondary | ICD-10-CM | POA: Diagnosis not present

## 2015-10-31 DIAGNOSIS — F17200 Nicotine dependence, unspecified, uncomplicated: Secondary | ICD-10-CM | POA: Diagnosis not present

## 2015-10-31 DIAGNOSIS — Z720 Tobacco use: Secondary | ICD-10-CM | POA: Diagnosis not present

## 2015-10-31 DIAGNOSIS — J449 Chronic obstructive pulmonary disease, unspecified: Secondary | ICD-10-CM | POA: Diagnosis not present

## 2015-10-31 DIAGNOSIS — Z79899 Other long term (current) drug therapy: Secondary | ICD-10-CM | POA: Diagnosis not present

## 2015-12-26 DIAGNOSIS — J449 Chronic obstructive pulmonary disease, unspecified: Secondary | ICD-10-CM | POA: Diagnosis not present

## 2015-12-26 DIAGNOSIS — R5383 Other fatigue: Secondary | ICD-10-CM | POA: Diagnosis not present

## 2015-12-26 DIAGNOSIS — Z299 Encounter for prophylactic measures, unspecified: Secondary | ICD-10-CM | POA: Diagnosis not present

## 2015-12-26 DIAGNOSIS — R0600 Dyspnea, unspecified: Secondary | ICD-10-CM | POA: Diagnosis not present

## 2015-12-26 DIAGNOSIS — J329 Chronic sinusitis, unspecified: Secondary | ICD-10-CM | POA: Diagnosis not present

## 2015-12-27 DIAGNOSIS — I700 Atherosclerosis of aorta: Secondary | ICD-10-CM | POA: Diagnosis not present

## 2015-12-27 DIAGNOSIS — R05 Cough: Secondary | ICD-10-CM | POA: Diagnosis not present

## 2015-12-27 DIAGNOSIS — R0602 Shortness of breath: Secondary | ICD-10-CM | POA: Diagnosis not present

## 2016-01-02 DIAGNOSIS — R0602 Shortness of breath: Secondary | ICD-10-CM | POA: Diagnosis not present

## 2016-02-03 DIAGNOSIS — H43812 Vitreous degeneration, left eye: Secondary | ICD-10-CM | POA: Diagnosis not present

## 2016-02-03 DIAGNOSIS — H5203 Hypermetropia, bilateral: Secondary | ICD-10-CM | POA: Diagnosis not present

## 2016-02-03 DIAGNOSIS — H52223 Regular astigmatism, bilateral: Secondary | ICD-10-CM | POA: Diagnosis not present

## 2016-02-03 DIAGNOSIS — H524 Presbyopia: Secondary | ICD-10-CM | POA: Diagnosis not present

## 2016-02-25 DIAGNOSIS — J069 Acute upper respiratory infection, unspecified: Secondary | ICD-10-CM | POA: Diagnosis not present

## 2016-02-25 DIAGNOSIS — G4700 Insomnia, unspecified: Secondary | ICD-10-CM | POA: Diagnosis not present

## 2016-02-25 DIAGNOSIS — I10 Essential (primary) hypertension: Secondary | ICD-10-CM | POA: Diagnosis not present

## 2016-02-25 DIAGNOSIS — J449 Chronic obstructive pulmonary disease, unspecified: Secondary | ICD-10-CM | POA: Diagnosis not present

## 2016-05-08 DIAGNOSIS — J449 Chronic obstructive pulmonary disease, unspecified: Secondary | ICD-10-CM | POA: Diagnosis not present

## 2016-05-08 DIAGNOSIS — Z299 Encounter for prophylactic measures, unspecified: Secondary | ICD-10-CM | POA: Diagnosis not present

## 2016-05-08 DIAGNOSIS — Z720 Tobacco use: Secondary | ICD-10-CM | POA: Diagnosis not present

## 2016-05-08 DIAGNOSIS — K219 Gastro-esophageal reflux disease without esophagitis: Secondary | ICD-10-CM | POA: Diagnosis not present

## 2016-05-08 DIAGNOSIS — R079 Chest pain, unspecified: Secondary | ICD-10-CM | POA: Diagnosis not present

## 2016-05-18 DIAGNOSIS — R079 Chest pain, unspecified: Secondary | ICD-10-CM | POA: Diagnosis not present

## 2016-05-18 DIAGNOSIS — R0602 Shortness of breath: Secondary | ICD-10-CM | POA: Diagnosis not present

## 2016-05-19 ENCOUNTER — Encounter (INDEPENDENT_AMBULATORY_CARE_PROVIDER_SITE_OTHER): Payer: Self-pay

## 2016-05-19 ENCOUNTER — Encounter (INDEPENDENT_AMBULATORY_CARE_PROVIDER_SITE_OTHER): Payer: Self-pay | Admitting: Internal Medicine

## 2016-05-28 DIAGNOSIS — E7800 Pure hypercholesterolemia, unspecified: Secondary | ICD-10-CM | POA: Diagnosis not present

## 2016-05-28 DIAGNOSIS — J449 Chronic obstructive pulmonary disease, unspecified: Secondary | ICD-10-CM | POA: Diagnosis not present

## 2016-06-01 DIAGNOSIS — Z299 Encounter for prophylactic measures, unspecified: Secondary | ICD-10-CM | POA: Diagnosis not present

## 2016-06-01 DIAGNOSIS — Z6832 Body mass index (BMI) 32.0-32.9, adult: Secondary | ICD-10-CM | POA: Diagnosis not present

## 2016-06-01 DIAGNOSIS — Z1211 Encounter for screening for malignant neoplasm of colon: Secondary | ICD-10-CM | POA: Diagnosis not present

## 2016-06-01 DIAGNOSIS — J441 Chronic obstructive pulmonary disease with (acute) exacerbation: Secondary | ICD-10-CM | POA: Diagnosis not present

## 2016-06-01 DIAGNOSIS — E7800 Pure hypercholesterolemia, unspecified: Secondary | ICD-10-CM | POA: Diagnosis not present

## 2016-06-01 DIAGNOSIS — R5383 Other fatigue: Secondary | ICD-10-CM | POA: Diagnosis not present

## 2016-06-01 DIAGNOSIS — Z7189 Other specified counseling: Secondary | ICD-10-CM | POA: Diagnosis not present

## 2016-06-01 DIAGNOSIS — Z0000 Encounter for general adult medical examination without abnormal findings: Secondary | ICD-10-CM | POA: Diagnosis not present

## 2016-06-01 DIAGNOSIS — Z1389 Encounter for screening for other disorder: Secondary | ICD-10-CM | POA: Diagnosis not present

## 2016-06-02 ENCOUNTER — Encounter (INDEPENDENT_AMBULATORY_CARE_PROVIDER_SITE_OTHER): Payer: Self-pay | Admitting: Internal Medicine

## 2016-06-02 ENCOUNTER — Ambulatory Visit (INDEPENDENT_AMBULATORY_CARE_PROVIDER_SITE_OTHER): Payer: Medicare Other | Admitting: Internal Medicine

## 2016-06-02 VITALS — BP 136/84 | HR 64 | Temp 97.6°F | Ht 61.5 in | Wt 177.7 lb

## 2016-06-02 DIAGNOSIS — K580 Irritable bowel syndrome with diarrhea: Secondary | ICD-10-CM | POA: Diagnosis not present

## 2016-06-02 DIAGNOSIS — K219 Gastro-esophageal reflux disease without esophagitis: Secondary | ICD-10-CM | POA: Insufficient documentation

## 2016-06-02 DIAGNOSIS — J441 Chronic obstructive pulmonary disease with (acute) exacerbation: Secondary | ICD-10-CM | POA: Insufficient documentation

## 2016-06-02 DIAGNOSIS — J449 Chronic obstructive pulmonary disease, unspecified: Secondary | ICD-10-CM

## 2016-06-02 HISTORY — DX: Chronic obstructive pulmonary disease, unspecified: J44.9

## 2016-06-02 MED ORDER — DICYCLOMINE HCL 10 MG PO CAPS: 10.0000 mg | ORAL_CAPSULE | Freq: Two times a day (BID) | ORAL | 3 refills | Status: DC

## 2016-06-02 NOTE — Patient Instructions (Addendum)
Fecal urgency. Fiber 4gm po. Dicyclomine 10mg  BID. Stool dairy OV in 8 weeks.

## 2016-06-02 NOTE — Progress Notes (Signed)
   Subjective:    Patient ID: Jill Braun, female    DOB: 1946/06/19, 70 y.o.   MRN: [id number]  HPI Referred by Dr. Woody Seller for abdominal pain. She tells me for about 6 months, she eats and then she has to have a BM. Her stools are loose, but not like water. She has urgency. No melena or BRRB. Her acid reflux is controlled with Protonix.  She is having 2 BMs a day. Her stools are not always loose, but the majority are.  She has not tried any medication for the urgency.  He appetite is fair. She has not lost any weight. She eats approximately 2 meals a day.   Last Colonoscopy was 06/15/2013 which revealed mild diverticulosis in the sigmoid colon. Pedunculated polyp, multiple.  Review of Systems Past Medical History:  Diagnosis Date  . COPD exacerbation ([place]) 06/02/2016  . GERD (gastroesophageal reflux disease)     Past Surgical History:  Procedure Laterality Date  . ABDOMINAL HYSTERECTOMY    . CATARACT EXTRACTION W/PHACO Right 02/01/2014   Procedure: CATARACT EXTRACTION PHACO AND INTRAOCULAR LENS PLACEMENT (IOC);  Surgeon: Tonny Branch, MD;  Location: [facility];  Service: Ophthalmology;  Laterality: Right;  CDE 7.59  . CATARACT EXTRACTION W/PHACO Left 02/22/2014   Procedure: CATARACT EXTRACTION PHACO AND INTRAOCULAR LENS PLACEMENT (IOC);  Surgeon: Tonny Branch, MD;  Location: [facility];  Service: Ophthalmology;  Laterality: Left;  CDE 8.12  . CHOLECYSTECTOMY      No Known Allergies  Current Outpatient Prescriptions on File Prior to Visit  Medication Sig Dispense Refill  . ibuprofen (ADVIL,MOTRIN) 200 MG tablet Take 200 mg by mouth every 6 (six) hours as needed. Pain     No current facility-administered medications on file prior to visit.        Objective:   Physical Exam Blood pressure 136/84, pulse 64, temperature 97.6 F (36.4 C), height 5' 1.5" (1.562 m), weight 177 lb 11.2 oz (80.6 kg). Alert and oriented. Skin warm and dry. Oral mucosa is moist.   . Sclera anicteric, conjunctivae is  pink. Thyroid not enlarged. No cervical lymphadenopathy. Lungs clear. Heart regular rate and rhythm.  Abdomen is soft. Bowel sounds are positive. No hepatomegaly. No abdominal masses felt. No tenderness.  1+ edema to lower extremities.          Assessment & Plan:  Fecal urgency, probable IBS.  Fiber 4 gm po. Dicyclomine 10mg  twice a day. Stool diary.  OV in 8 weeks.  GERD: Continue Protonix

## 2016-06-19 ENCOUNTER — Encounter (INDEPENDENT_AMBULATORY_CARE_PROVIDER_SITE_OTHER): Payer: Self-pay

## 2016-06-23 ENCOUNTER — Ambulatory Visit (INDEPENDENT_AMBULATORY_CARE_PROVIDER_SITE_OTHER): Payer: Medicare Other | Admitting: Cardiology

## 2016-06-23 ENCOUNTER — Encounter: Payer: Self-pay | Admitting: *Deleted

## 2016-06-23 ENCOUNTER — Encounter: Payer: Self-pay | Admitting: Cardiology

## 2016-06-23 VITALS — BP 105/71 | HR 75 | Ht 61.0 in | Wt 176.0 lb

## 2016-06-23 DIAGNOSIS — R0789 Other chest pain: Secondary | ICD-10-CM | POA: Diagnosis not present

## 2016-06-23 DIAGNOSIS — J449 Chronic obstructive pulmonary disease, unspecified: Secondary | ICD-10-CM

## 2016-06-23 DIAGNOSIS — I10 Essential (primary) hypertension: Secondary | ICD-10-CM | POA: Diagnosis not present

## 2016-06-23 DIAGNOSIS — Z8639 Personal history of other endocrine, nutritional and metabolic disease: Secondary | ICD-10-CM

## 2016-06-23 DIAGNOSIS — R072 Precordial pain: Secondary | ICD-10-CM | POA: Diagnosis not present

## 2016-06-23 NOTE — Patient Instructions (Signed)
Medication Instructions:  Continue all current medications.  Labwork: none  Testing/Procedures: Your physician has requested that you have a lexiscan myoview. For further information please visit [email or web address]. Please follow instruction sheet, as given.  Office will contact with results via phone or letter.    Follow-Up: Pending test results   Any Other Special Instructions Will Be Listed Below (If Applicable).  If you need a refill on your cardiac medications before your next appointment, please call your pharmacy.  

## 2016-06-23 NOTE — Progress Notes (Signed)
Cardiology Office Note  Date: 06/23/2016   ID: [place], [place] 29-Mar-1946, MRN [id number]  PCP: Glenda Chroman, MD  Consulting Cardiologist: Rozann Lesches, MD   Chief Complaint  Patient presents with  . Thoracic discomfort    History of Present Illness: Jill Braun is a 70 y.o. female referred for cardiology consultation by Dr. Woody Seller. She is here today with her daughter. She states that over the last 4-5 months she has been experiencing a discomfort in her mid thoracic area, pain in her right arm at times and also a "tingling" in her left hand. These symptoms are not necessarily associated with exertion in an of itself. She states that certain positions when she sits or twists or moves her right arm, can bring this on.  Previous cardiac evaluation includes normal cardiac catheterization from 2009. Recent echocardiogram done at [facility] is outlined below, overall reassuring, we discussed the results.  I reviewed her medications which are outlined below. She reports no recent additions. She does use an inhaler for intermittent shortness of breath, states that it is effective.  She has 2 sisters that reportedly died with heart disease.  Past Medical History:  Diagnosis Date  . COPD (chronic obstructive pulmonary disease) ([place]) 06/02/2016  . Essential hypertension   . GERD (gastroesophageal reflux disease)   . History of cardiac catheterization    Normal coronary arteries 2009  . Hyperlipidemia   . Middle cerebral artery aneurysm    Left - [facility] 2005    Past Surgical History:  Procedure Laterality Date  . ABDOMINAL HYSTERECTOMY    . CATARACT EXTRACTION W/PHACO Right 02/01/2014   Procedure: CATARACT EXTRACTION PHACO AND INTRAOCULAR LENS PLACEMENT (IOC);  Surgeon: Tonny Branch, MD;  Location: [facility];  Service: Ophthalmology;  Laterality: Right;  CDE 7.59  . CATARACT EXTRACTION W/PHACO Left 02/22/2014   Procedure: CATARACT EXTRACTION PHACO AND INTRAOCULAR LENS  PLACEMENT (IOC);  Surgeon: Tonny Branch, MD;  Location: [facility];  Service: Ophthalmology;  Laterality: Left;  CDE 8.12  . CHOLECYSTECTOMY      Current Outpatient Prescriptions  Medication Sig Dispense Refill  . Albuterol Sulfate 108 (90 Base) MCG/ACT AEPB Inhale into the lungs.    . dicyclomine (BENTYL) 10 MG capsule Take 1 capsule (10 mg total) by mouth 2 (two) times daily before a meal. 60 capsule 3  . ibuprofen (ADVIL,MOTRIN) 200 MG tablet Take 200 mg by mouth every 6 (six) hours as needed. Pain    . Multiple Vitamin (MULTIVITAMIN) tablet Take 1 tablet by mouth daily.    . pantoprazole (PROTONIX) 40 MG tablet Take 40 mg by mouth daily.     No current facility-administered medications for this visit.    Allergies:  Patient has no known allergies.   Social History: The patient  reports that she has been smoking Cigarettes.  She has a 11.75 pack-year smoking history. She has never used smokeless tobacco. She reports that she does not drink alcohol or use drugs.   Family History: The patient's family history includes Breast cancer in her mother; Diabetes Mellitus II in her father; Heart attack in her father and mother.   ROS:  Please see the history of present illness. Otherwise, complete review of systems is positive for NYHA class II dyspnea.  All other systems are reviewed and negative.   Physical Exam: VS:  BP 105/71   Pulse 75   Ht 5\' 1"  (1.549 m)   Wt 176 lb (79.8 kg)   SpO2  98%   BMI 33.25 kg/m , BMI Body mass index is 33.25 kg/m.  Wt Readings from Last 3 Encounters:  06/23/16 176 lb (79.8 kg)  06/02/16 177 lb 11.2 oz (80.6 kg)  01/29/14 170 lb (77.1 kg)    General: Appears comfortable at rest. HEENT: Conjunctiva and lids normal, oropharynx clear. Neck: Supple, no elevated JVP or carotid bruits, no thyromegaly. Lungs: Decreased breath sounds but no wheezing, nonlabored breathing at rest. Cardiac: Regular rate and rhythm, no S3, soft systolic murmur, no pericardial  rub. Abdomen: Soft, nontender, bowel sounds present. Extremities: No pitting edema, distal pulses 2+. Skin: Warm and dry. Musculoskeletal: No kyphosis. Neuropsychiatric: Alert and oriented x3, affect grossly appropriate.  ECG: I personally reviewed the tracing from 05/08/2016 which shows normal sinus rhythm with incomplete right bundle block and low voltage.  Recent Labwork:  May 2017: Hemoglobin 15.8, platelets 214, TSH 1.19, BUN 9, creatinine 0.7, potassium 4.5, AST 17, ALT 14  Other Studies Reviewed Today:  Cardiac catheterization 02/29/2008: FINDINGS:  Aortic pressure 112/64 with a mean of 85, left ventricular  pressure 109/13.   Left mainstem:  Angiographically normal.  Trifurcates into LAD, left  circumflex, and ramus intermedius.   LAD:  Moderate-sized vessel that courses down to the LV apex and the  midportion of the LAD.  The vessel divides into a twin LAD system.  There is a high first diagonal branch that is widely patent.  There is  no significant stenosis throughout the LAD or its branch vessels.   Intermediate branch moderate-sized vessel, tortuous in the midportion,  widely patent with no stenosis.   Left circumflex single OM branch AV groove circumflex is tiny beyond the  OM.  No stenosis.   Right coronary artery:  The RCA is dominant.  It is a large vessel.  It  supplies a small RV marginal branch, large acute marginal branch.  A  single PDA and two posterolateral branches.  There is no significant  stenosis throughout the right coronary artery or its branch vessels.   Left ventriculography:  LV function is normal.  The LVEF is estimated at  65%.  There is no mitral regurgitation.   ASSESSMENT:  1. Normal coronary arteries.  2. Normal left ventricular function.  3. Normal left ventricular filling pressure.  Echocardiogram 05/18/2016 [facility]): Borderline increased LV wall thickness with LVEF 70-75%, possible diastolic dysfunction,  normal right ventricular contraction, trace mitral regurgitation, mild tricuspid regurgitation with normal estimated RVSP 20-30 mmHg.  Assessment and Plan:  1. Atypical thoracic discomfort as outlined above. Based on description I wonder about the possibility of degenerative disc disease with nerve impingement. She does have a history of hypertension and hyperlipidemia, also family history of CAD in 2 sisters. Cardiac catheterization from 2009 revealed normal coronary arteries, but she has not had any interval ischemic testing. We discussed arranging a [place] for further assessment.  2. Essential hypertension, blood pressure is normal today. Recent echocardiogram shows normal LVEF with mention of possible diastolic dysfunction.  3. Hyperlipidemia by history. Currently managed by diet.  4. COPD, on inhalers and stable symptomatically.  Current medicines were reviewed with the patient today.   Orders Placed This Encounter  Procedures  . NM Myocar Multi W/Spect W/Wall Motion / EF  . Myocardial Perfusion Imaging    Disposition: Call with test results.  Signed, Satira Sark, MD, [facility] 06/23/2016 10:54 AM    [facility] [place], [place], [place] [address]  Phone: [phone]; Fax: [phone]

## 2016-06-26 ENCOUNTER — Encounter (HOSPITAL_COMMUNITY): Payer: Self-pay

## 2016-06-26 ENCOUNTER — Inpatient Hospital Stay (HOSPITAL_COMMUNITY): Admission: RE | Admit: 2016-06-26 | Payer: Medicare Other | Source: Ambulatory Visit

## 2016-06-26 ENCOUNTER — Encounter (HOSPITAL_COMMUNITY)
Admission: RE | Admit: 2016-06-26 | Discharge: 2016-06-26 | Disposition: A | Discharge status: Home / Self Care | Payer: Medicare Other | Source: Ambulatory Visit | Attending: Cardiology | Admitting: Cardiology

## 2016-06-26 DIAGNOSIS — R0789 Other chest pain: Secondary | ICD-10-CM | POA: Diagnosis not present

## 2016-06-26 LAB — NM MYOCAR MULTI W/SPECT W/WALL MOTION / EF
CHL CUP NUCLEAR SDS: 0
CHL CUP NUCLEAR SRS: 1
CHL CUP NUCLEAR SSS: 1
LHR: 0
LV dias vol: 48 mL (ref 46–106)
LV sys vol: 23 mL
NUC STRESS TID: 1.2
Peak HR: 127 {beats}/min
Rest HR: 81 {beats}/min

## 2016-06-26 MED ORDER — TECHNETIUM TC 99M TETROFOSMIN IV KIT
10.0000 | PACK | Freq: Once | INTRAVENOUS | Status: AC | PRN
Start: 2016-06-26 — End: 2016-06-26
  Administered 2016-06-26: 10.11 via INTRAVENOUS

## 2016-06-26 MED ORDER — REGADENOSON 0.4 MG/5ML IV SOLN
INTRAVENOUS | Status: AC
  Administered 2016-06-26: 0.4 mg via INTRAVENOUS
  Filled 2016-06-26: qty 5

## 2016-06-26 MED ORDER — SODIUM CHLORIDE 0.9% FLUSH
INTRAVENOUS | Status: AC
  Administered 2016-06-26: 10 mL via INTRAVENOUS
  Filled 2016-06-26: qty 10

## 2016-06-26 MED ORDER — TECHNETIUM TC 99M TETROFOSMIN IV KIT
30.0000 | PACK | Freq: Once | INTRAVENOUS | Status: AC | PRN
  Administered 2016-06-26: 30 via INTRAVENOUS

## 2016-07-02 ENCOUNTER — Telehealth: Payer: Self-pay | Admitting: *Deleted

## 2016-07-02 NOTE — Telephone Encounter (Signed)
Notes Recorded by Laurine Blazer, LPN on 075-GRM at [id number] AM EST Patient notified and verbalized understanding. Copy to pmd. ------  Notes Recorded by Satira Sark, MD on 06/27/2016 at 8:00 AM EST Results reviewed. Reassuring results. No ischemia to suggest development of significant CAD. LVEF calculated 53% but appeared higher and in fact was normal by recent echocardiogram. No further cardiac testing anticipated at this time. Should keep follow-up with Dr. Woody Seller. A copy of this test should be forwarded to Glenda Chroman, MD.

## 2016-07-14 DIAGNOSIS — M75121 Complete rotator cuff tear or rupture of right shoulder, not specified as traumatic: Secondary | ICD-10-CM | POA: Diagnosis not present

## 2016-07-28 ENCOUNTER — Ambulatory Visit (INDEPENDENT_AMBULATORY_CARE_PROVIDER_SITE_OTHER): Payer: Medicare Other | Admitting: Internal Medicine

## 2016-12-09 DIAGNOSIS — N390 Urinary tract infection, site not specified: Secondary | ICD-10-CM | POA: Diagnosis not present

## 2016-12-09 DIAGNOSIS — Z713 Dietary counseling and surveillance: Secondary | ICD-10-CM | POA: Diagnosis not present

## 2016-12-09 DIAGNOSIS — I10 Essential (primary) hypertension: Secondary | ICD-10-CM | POA: Diagnosis not present

## 2016-12-09 DIAGNOSIS — J449 Chronic obstructive pulmonary disease, unspecified: Secondary | ICD-10-CM | POA: Diagnosis not present

## 2016-12-09 DIAGNOSIS — Z299 Encounter for prophylactic measures, unspecified: Secondary | ICD-10-CM | POA: Diagnosis not present

## 2016-12-09 DIAGNOSIS — E785 Hyperlipidemia, unspecified: Secondary | ICD-10-CM | POA: Diagnosis not present

## 2016-12-09 DIAGNOSIS — Z6833 Body mass index (BMI) 33.0-33.9, adult: Secondary | ICD-10-CM | POA: Diagnosis not present

## 2016-12-09 DIAGNOSIS — M8580 Other specified disorders of bone density and structure, unspecified site: Secondary | ICD-10-CM | POA: Diagnosis not present

## 2017-02-17 DIAGNOSIS — J449 Chronic obstructive pulmonary disease, unspecified: Secondary | ICD-10-CM | POA: Diagnosis not present

## 2017-02-17 DIAGNOSIS — E7800 Pure hypercholesterolemia, unspecified: Secondary | ICD-10-CM | POA: Diagnosis not present

## 2017-02-25 DIAGNOSIS — J449 Chronic obstructive pulmonary disease, unspecified: Secondary | ICD-10-CM | POA: Diagnosis not present

## 2017-02-25 DIAGNOSIS — K130 Diseases of lips: Secondary | ICD-10-CM | POA: Diagnosis not present

## 2017-02-25 DIAGNOSIS — E785 Hyperlipidemia, unspecified: Secondary | ICD-10-CM | POA: Diagnosis not present

## 2017-02-25 DIAGNOSIS — F17210 Nicotine dependence, cigarettes, uncomplicated: Secondary | ICD-10-CM | POA: Diagnosis not present

## 2017-02-25 DIAGNOSIS — Z299 Encounter for prophylactic measures, unspecified: Secondary | ICD-10-CM | POA: Diagnosis not present

## 2017-02-25 DIAGNOSIS — Z6833 Body mass index (BMI) 33.0-33.9, adult: Secondary | ICD-10-CM | POA: Diagnosis not present

## 2017-02-25 DIAGNOSIS — I10 Essential (primary) hypertension: Secondary | ICD-10-CM | POA: Diagnosis not present

## 2017-04-07 DIAGNOSIS — G4700 Insomnia, unspecified: Secondary | ICD-10-CM | POA: Diagnosis not present

## 2017-04-07 DIAGNOSIS — J449 Chronic obstructive pulmonary disease, unspecified: Secondary | ICD-10-CM | POA: Diagnosis not present

## 2017-04-07 DIAGNOSIS — Z299 Encounter for prophylactic measures, unspecified: Secondary | ICD-10-CM | POA: Diagnosis not present

## 2017-04-07 DIAGNOSIS — F17210 Nicotine dependence, cigarettes, uncomplicated: Secondary | ICD-10-CM | POA: Diagnosis not present

## 2017-04-07 DIAGNOSIS — B029 Zoster without complications: Secondary | ICD-10-CM | POA: Diagnosis not present

## 2017-04-07 DIAGNOSIS — Z6834 Body mass index (BMI) 34.0-34.9, adult: Secondary | ICD-10-CM | POA: Diagnosis not present

## 2017-05-24 ENCOUNTER — Ambulatory Visit (INDEPENDENT_AMBULATORY_CARE_PROVIDER_SITE_OTHER): Payer: Medicare Other | Admitting: Internal Medicine

## 2017-06-01 ENCOUNTER — Encounter (INDEPENDENT_AMBULATORY_CARE_PROVIDER_SITE_OTHER): Payer: Self-pay | Admitting: Internal Medicine

## 2017-06-01 ENCOUNTER — Encounter (INDEPENDENT_AMBULATORY_CARE_PROVIDER_SITE_OTHER): Payer: Self-pay | Admitting: *Deleted

## 2017-06-01 ENCOUNTER — Ambulatory Visit (INDEPENDENT_AMBULATORY_CARE_PROVIDER_SITE_OTHER): Payer: Medicare Other | Admitting: Internal Medicine

## 2017-06-01 ENCOUNTER — Encounter (INDEPENDENT_AMBULATORY_CARE_PROVIDER_SITE_OTHER): Payer: Self-pay

## 2017-06-01 VITALS — BP 144/90 | HR 92 | Temp 97.9°F | Ht 61.0 in | Wt 193.4 lb

## 2017-06-01 DIAGNOSIS — K580 Irritable bowel syndrome with diarrhea: Secondary | ICD-10-CM | POA: Diagnosis not present

## 2017-06-01 MED ORDER — DICYCLOMINE HCL 10 MG PO CAPS: 10.0000 mg | ORAL_CAPSULE | Freq: Two times a day (BID) | ORAL | 3 refills | Status: DC

## 2017-06-01 NOTE — Progress Notes (Addendum)
Subjective:    Patient ID: Jill Braun, female    DOB: [birth year], 71 y.o.   MRN: [id number]  HPI Presents today with c/o  Last seen in December of 2017. Hx of IBS/diarrhea. Advised to keep stool diary, fiber 4gm po. Given Rx for Dicyclomine 10mg  BID.  She tells me today she is wearing Depends. When she eats, 15-20 minutes later, she will have diarrhea. She describes as clumps. She has not seen any blood.  She says the diarrhea has been going on for about a year.  She was suppose to have an OV 8 weeks after her initial visit but cancelled. Went to [place] to take care of her mother who is now deceased. She is having a BM x 2 a day. Stools are loose.  She describes the stools as large. She has been incontinent.  She has tried Imodium which helped but when she stopped, the diarrhea would come right back.  Appetite is good. No weight loss. No abdominal pain.  No recent antibiotics.  No family hx of colon cancer. Mother and sister had breast cancer.  She is a smoker.   Last Colonoscopy was 06/15/2013 by Dr. Ladona Horns (screening) which revealed mild diverticulosis in the sigmoid colon. Pedunculated polypd, multiple. Biopsy: Biopsy: hyperplastic polyp, serrated adenoma, Hyperplastic polyps., Tubular adenoma, Hyperplastic polyp, hyperplastic polyp Next colonoscopy in 3 yrs.   Review of Systems Past Medical History:  Diagnosis Date  . COPD (chronic obstructive pulmonary disease) ([place]) 06/02/2016  . Essential hypertension   . GERD (gastroesophageal reflux disease)   . History of cardiac catheterization    Normal coronary arteries 2009  . Hyperlipidemia   . Middle cerebral artery aneurysm    Left - [facility] 2005    Past Surgical History:  Procedure Laterality Date  . ABDOMINAL HYSTERECTOMY    . CATARACT EXTRACTION W/PHACO Right 02/01/2014   Procedure: CATARACT EXTRACTION PHACO AND INTRAOCULAR LENS PLACEMENT (IOC);  Surgeon: Tonny Branch, MD;  Location: [facility];  Service: Ophthalmology;   Laterality: Right;  CDE 7.59  . CATARACT EXTRACTION W/PHACO Left 02/22/2014   Procedure: CATARACT EXTRACTION PHACO AND INTRAOCULAR LENS PLACEMENT (IOC);  Surgeon: Tonny Branch, MD;  Location: [facility];  Service: Ophthalmology;  Laterality: Left;  CDE 8.12  . Cerebral aneurysm surgery     2003 at [facility].   . CHOLECYSTECTOMY      No Known Allergies  Current Outpatient Medications on File Prior to Visit  Medication Sig Dispense Refill  . Albuterol Sulfate 108 (90 Base) MCG/ACT AEPB Inhale into the lungs.    Marland Kitchen esomeprazole (NEXIUM) 40 MG capsule Take 40 mg by mouth daily at 12 noon.    Marland Kitchen ibuprofen (ADVIL,MOTRIN) 200 MG tablet Take 200 mg by mouth every 6 (six) hours as needed. Pain    . Multiple Vitamin (MULTIVITAMIN) tablet Take 1 tablet by mouth daily.     No current facility-administered medications on file prior to visit.         Objective:   Physical Exam Blood pressure (!) 144/90, pulse 92, temperature 97.9 F (36.6 C), height 5\' 1"  (1.549 m), weight 193 lb 6.4 oz (87.7 kg). Alert and oriented. Skin warm and dry. Oral mucosa is moist.   . Sclera anicteric, conjunctivae is pink. Thyroid not enlarged. No cervical lymphadenopathy. Lungs clear. Heart regular rate and rhythm.  Abdomen is soft. Bowel sounds are positive. No hepatomegaly. No abdominal masses felt. No tenderness.  No edema to lower extremities.  Stool Terlecki and guaiac negative. No  masses felt.          Assessment & Plan:  Diarrhea. Will schedule a colonoscopy. Colonic neoplasm needs to be ruled out.  Fiber 4 gms po. Dicyclomine 10mg  BID.

## 2017-06-01 NOTE — Patient Instructions (Addendum)
Rx for Dicyclomine 10mg  BID. Fiber 4 gm po., Colonoscopy.  The risks of bleeding, perforation and infection were reviewed with patient.

## 2017-06-02 DIAGNOSIS — G4700 Insomnia, unspecified: Secondary | ICD-10-CM | POA: Diagnosis not present

## 2017-06-02 DIAGNOSIS — Z299 Encounter for prophylactic measures, unspecified: Secondary | ICD-10-CM | POA: Diagnosis not present

## 2017-06-02 DIAGNOSIS — Z1211 Encounter for screening for malignant neoplasm of colon: Secondary | ICD-10-CM | POA: Diagnosis not present

## 2017-06-02 DIAGNOSIS — Z0000 Encounter for general adult medical examination without abnormal findings: Secondary | ICD-10-CM | POA: Diagnosis not present

## 2017-06-02 DIAGNOSIS — Z79899 Other long term (current) drug therapy: Secondary | ICD-10-CM | POA: Diagnosis not present

## 2017-06-02 DIAGNOSIS — Z6834 Body mass index (BMI) 34.0-34.9, adult: Secondary | ICD-10-CM | POA: Diagnosis not present

## 2017-06-02 DIAGNOSIS — R5383 Other fatigue: Secondary | ICD-10-CM | POA: Diagnosis not present

## 2017-06-02 DIAGNOSIS — E7800 Pure hypercholesterolemia, unspecified: Secondary | ICD-10-CM | POA: Diagnosis not present

## 2017-06-02 DIAGNOSIS — Z7189 Other specified counseling: Secondary | ICD-10-CM | POA: Diagnosis not present

## 2017-06-02 DIAGNOSIS — Z1331 Encounter for screening for depression: Secondary | ICD-10-CM | POA: Diagnosis not present

## 2017-06-02 DIAGNOSIS — Z1339 Encounter for screening examination for other mental health and behavioral disorders: Secondary | ICD-10-CM | POA: Diagnosis not present

## 2017-06-29 DIAGNOSIS — A059 Bacterial foodborne intoxication, unspecified: Secondary | ICD-10-CM

## 2017-06-29 HISTORY — DX: Bacterial foodborne intoxication, unspecified: A05.9

## 2017-07-02 ENCOUNTER — Encounter (HOSPITAL_COMMUNITY): Payer: Self-pay | Admitting: *Deleted

## 2017-07-02 ENCOUNTER — Other Ambulatory Visit: Payer: Self-pay

## 2017-07-02 ENCOUNTER — Encounter (HOSPITAL_COMMUNITY)
Admission: RE | Disposition: A | Discharge status: Home / Self Care | Payer: Self-pay | Source: Ambulatory Visit | Attending: Internal Medicine

## 2017-07-02 ENCOUNTER — Ambulatory Visit (HOSPITAL_COMMUNITY)
Admission: RE | Admit: 2017-07-02 | Discharge: 2017-07-02 | Disposition: A | Discharge status: Home / Self Care | Payer: Medicare Other | Source: Ambulatory Visit | Attending: Internal Medicine | Admitting: Internal Medicine

## 2017-07-02 DIAGNOSIS — K5730 Diverticulosis of large intestine without perforation or abscess without bleeding: Secondary | ICD-10-CM | POA: Insufficient documentation

## 2017-07-02 DIAGNOSIS — Q438 Other specified congenital malformations of intestine: Secondary | ICD-10-CM | POA: Diagnosis not present

## 2017-07-02 DIAGNOSIS — E785 Hyperlipidemia, unspecified: Secondary | ICD-10-CM | POA: Insufficient documentation

## 2017-07-02 DIAGNOSIS — D123 Benign neoplasm of transverse colon: Secondary | ICD-10-CM | POA: Diagnosis not present

## 2017-07-02 DIAGNOSIS — K644 Residual hemorrhoidal skin tags: Secondary | ICD-10-CM | POA: Insufficient documentation

## 2017-07-02 DIAGNOSIS — D120 Benign neoplasm of cecum: Secondary | ICD-10-CM | POA: Diagnosis not present

## 2017-07-02 DIAGNOSIS — J449 Chronic obstructive pulmonary disease, unspecified: Secondary | ICD-10-CM | POA: Insufficient documentation

## 2017-07-02 DIAGNOSIS — Z86010 Personal history of colonic polyps: Secondary | ICD-10-CM | POA: Diagnosis not present

## 2017-07-02 DIAGNOSIS — K580 Irritable bowel syndrome with diarrhea: Secondary | ICD-10-CM

## 2017-07-02 DIAGNOSIS — K529 Noninfective gastroenteritis and colitis, unspecified: Secondary | ICD-10-CM | POA: Insufficient documentation

## 2017-07-02 DIAGNOSIS — I10 Essential (primary) hypertension: Secondary | ICD-10-CM | POA: Insufficient documentation

## 2017-07-02 DIAGNOSIS — D124 Benign neoplasm of descending colon: Secondary | ICD-10-CM | POA: Insufficient documentation

## 2017-07-02 DIAGNOSIS — Z8249 Family history of ischemic heart disease and other diseases of the circulatory system: Secondary | ICD-10-CM | POA: Insufficient documentation

## 2017-07-02 DIAGNOSIS — K6289 Other specified diseases of anus and rectum: Secondary | ICD-10-CM | POA: Diagnosis not present

## 2017-07-02 DIAGNOSIS — D125 Benign neoplasm of sigmoid colon: Secondary | ICD-10-CM | POA: Diagnosis not present

## 2017-07-02 DIAGNOSIS — F17210 Nicotine dependence, cigarettes, uncomplicated: Secondary | ICD-10-CM | POA: Diagnosis not present

## 2017-07-02 DIAGNOSIS — D122 Benign neoplasm of ascending colon: Secondary | ICD-10-CM | POA: Diagnosis not present

## 2017-07-02 DIAGNOSIS — Z79899 Other long term (current) drug therapy: Secondary | ICD-10-CM | POA: Insufficient documentation

## 2017-07-02 DIAGNOSIS — K219 Gastro-esophageal reflux disease without esophagitis: Secondary | ICD-10-CM | POA: Diagnosis not present

## 2017-07-02 HISTORY — PX: BIOPSY: SHX5522

## 2017-07-02 HISTORY — PX: COLONOSCOPY: SHX5424

## 2017-07-02 HISTORY — PX: POLYPECTOMY: SHX5525

## 2017-07-02 SURGERY — COLONOSCOPY
Anesthesia: Moderate Sedation

## 2017-07-02 MED ORDER — STERILE WATER FOR IRRIGATION IR SOLN
Status: DC | PRN
  Administered 2017-07-02: 08:00:00

## 2017-07-02 MED ORDER — DICYCLOMINE HCL 10 MG PO CAPS: 10.0000 mg | ORAL_CAPSULE | Freq: Three times a day (TID) | ORAL | 5 refills | Status: DC

## 2017-07-02 MED ORDER — SODIUM CHLORIDE 0.9 % IV SOLN
INTRAVENOUS | Status: DC
  Administered 2017-07-02: 07:00:00 via INTRAVENOUS

## 2017-07-02 MED ORDER — MIDAZOLAM HCL 5 MG/5ML IJ SOLN
INTRAMUSCULAR | Status: AC
  Filled 2017-07-02: qty 10

## 2017-07-02 MED ORDER — MEPERIDINE HCL 50 MG/ML IJ SOLN
INTRAMUSCULAR | Status: DC
Start: 2017-07-02 — End: 2017-07-02
  Filled 2017-07-02: qty 1

## 2017-07-02 MED ORDER — MIDAZOLAM HCL 5 MG/5ML IJ SOLN
INTRAMUSCULAR | Status: DC | PRN
  Administered 2017-07-02 (×2): 2 mg via INTRAVENOUS
  Administered 2017-07-02 (×2): 1 mg via INTRAVENOUS

## 2017-07-02 MED ORDER — MEPERIDINE HCL 50 MG/ML IJ SOLN
INTRAMUSCULAR | Status: DC | PRN
  Administered 2017-07-02 (×2): 25 mg via INTRAVENOUS

## 2017-07-02 NOTE — H&P (Signed)
Jill Braun is an 72 y.o. female.   Chief Complaint: Patient is here for colonoscopy. HPI: Patient is 72 year old Caucasian female who presents with over a month history of nonbloody diarrhea.  She states she has a bowel movement after every meal and in between.  She denies abdominal pain rectal bleeding or melena.  She has not lost any weight.  No history of antibiotic use prior to onset of diarrhea.  She denies nocturnal bowel movements.  Past history significant for multiple polyps.  She had 2 sessile serrated polyps removed in December 2014 she had adenoma removed on prior colonoscopy. Family history is negative for CRC.  Past Medical History:  Diagnosis Date  . COPD (chronic obstructive pulmonary disease) ([place]) 06/02/2016  . Essential hypertension   . GERD (gastroesophageal reflux disease)   . History of cardiac catheterization    Normal coronary arteries 2009  . Hyperlipidemia   . Middle cerebral artery aneurysm    Left - [facility] 2005    Past Surgical History:  Procedure Laterality Date  . ABDOMINAL HYSTERECTOMY    . CATARACT EXTRACTION W/PHACO Right 02/01/2014   Procedure: CATARACT EXTRACTION PHACO AND INTRAOCULAR LENS PLACEMENT (IOC);  Surgeon: Tonny Branch, MD;  Location: [facility];  Service: Ophthalmology;  Laterality: Right;  CDE 7.59  . CATARACT EXTRACTION W/PHACO Left 02/22/2014   Procedure: CATARACT EXTRACTION PHACO AND INTRAOCULAR LENS PLACEMENT (IOC);  Surgeon: Tonny Branch, MD;  Location: [facility];  Service: Ophthalmology;  Laterality: Left;  CDE 8.12  . Cerebral aneurysm surgery     2003 at [facility].   . CHOLECYSTECTOMY      Family History  Problem Relation Age of Onset  . Breast cancer Mother   . Heart attack Mother   . Heart attack Father   . Diabetes Mellitus II Father   . Colon cancer Neg Hx    Social History:  reports that she has been smoking cigarettes.  She has a 11.75 pack-year smoking history. she has never used smokeless tobacco. She reports that she does not  drink alcohol or use drugs.  Allergies: No Known Allergies  Medications Prior to Admission  Medication Sig Dispense Refill  . Albuterol Sulfate 108 (90 Base) MCG/ACT AEPB Inhale 1-2 puffs into the lungs every 6 (six) hours as needed (for shortness of breath/wheezing).     [name] ELLIPTA 62.5-25 MCG/INH AEPB Inhale 1 puff into the lungs daily as needed. For shortness of breath/wheezing.  2  . dicyclomine (BENTYL) 10 MG capsule Take 1 capsule (10 mg total) by mouth 2 (two) times daily before a meal. 60 capsule 3  . esomeprazole (NEXIUM) 40 MG capsule Take 40 mg by mouth daily.     Marland Kitchen ibuprofen (ADVIL,MOTRIN) 200 MG tablet Take 400 mg by mouth 2 (two) times daily as needed (for pain.).     Marland Kitchen Multiple Vitamin (MULTIVITAMIN) tablet Take 1 tablet by mouth daily.    . polycarbophil (FIBERCON) 625 MG tablet Take 1,250 mg by mouth daily.    Marland Kitchen zolpidem (AMBIEN) 5 MG tablet Take 5 mg by mouth at bedtime as needed for sleep.  1    No results found for this or any previous visit (from the past 48 hour(s)). No results found.  ROS  Blood pressure 115/68, pulse 84, temperature 97.7 F (36.5 C), temperature source Axillary, resp. rate 15, height 5\' 1"  (1.549 m), weight 193 lb (87.5 kg), SpO2 94 %. Physical Exam  Constitutional: She appears well-developed and well-nourished.  HENT:  Mouth/Throat: Oropharynx  is clear and moist.  Eyes: Conjunctivae are normal. No scleral icterus.  Neck: No thyromegaly present.  Cardiovascular: Normal rate, regular rhythm and normal heart sounds.  No murmur heard. Respiratory: Effort normal and breath sounds normal.  GI:  Abdomen is symmetrical with long upper midline and shoulder lower midline scar.  Abdomen is soft and nontender without organomegaly or masses.  Musculoskeletal: She exhibits no edema.  Lymphadenopathy:    She has no cervical adenopathy.  Neurological: She is alert.  Skin: Skin is warm and dry.     Assessment/Plan Chronic diarrhea. History of  colonic polyps. Diagnostic colonoscopy.  Hildred Laser, MD 07/02/2017, 7:32 AM

## 2017-07-02 NOTE — Op Note (Signed)
[facility] Patient Name: Jill Braun Procedure Date: 07/02/2017 7:12 AM MRN: [id number] Date of Birth: 05-09-1946 Attending MD: Hildred Laser , MD CSN: [id number] Age: 72 Admit Type: Outpatient Procedure:                Colonoscopy Indications:              Chronic diarrhea Providers:                Hildred Laser, MD, Jeanann Lewandowsky. Sharon Seller, RN, Starla Link RN, RN Referring MD:             Arsenio Katz, [facility] Medicines:                Meperidine 50 mg IV, Midazolam 6 mg IV Complications:            No immediate complications. Estimated Blood Loss:     Estimated blood loss was minimal. Procedure:                Pre-Anesthesia Assessment:                           - Prior to the procedure, a History and Physical                            was performed, and patient medications and                            allergies were reviewed. The patient's tolerance of                            previous anesthesia was also reviewed. The risks                            and benefits of the procedure and the sedation                            options and risks were discussed with the patient.                            All questions were answered, and informed consent                            was obtained. Prior Anticoagulants: The patient                            last took ibuprofen 3 days prior to the procedure.                            ASA Grade Assessment: II - A patient with mild                            systemic disease. After reviewing the risks and  benefits, the patient was deemed in satisfactory                            condition to undergo the procedure.                           After obtaining informed consent, the colonoscope                            was passed under direct vision. Throughout the                            procedure, the patient's blood pressure, pulse, and                            oxygen  saturations were monitored continuously. The                            EC-[id number] ([id number]) scope was introduced through                            the anus and advanced to the the cecum, identified                            by appendiceal orifice and ileocecal valve. The                            colonoscopy was technically difficult and complex                            due to a tortuous colon. The patient tolerated the                            procedure well. The quality of the bowel                            preparation was good. The ileocecal valve,                            appendiceal orifice, and rectum were photographed. Scope In: 7:44:21 AM Scope Out: 8:34:57 AM Total Procedure Duration: 0 hours 50 minutes 36 seconds  Findings:      The perianal and digital rectal examinations were normal.      Seven sessile polyps were found in the sigmoid colon, hepatic flexure,       ascending colon and cecum. The polyps were small in size. These polyps       were removed with a cold snare. Resection and retrieval were complete.       The pathology specimen was placed into Bottle Number 1.      A 10 mm polyp was found in the ascending colon. The polyp was       semi-sessile. The polyp was removed with a hot snare. Resection and       retrieval were complete. The pathology specimen was placed into Bottle  Number 2.      A 12 mm polyp was found in the distal sigmoid colon. The polyp was       pedunculated. The polyp was removed with a hot snare. Resection and       retrieval were complete. The pathology specimen was placed into Bottle       Number 3.      Normal mucosa was found in the sigmoid colon. Biopsies for histology       were taken with a cold forceps from the sigmoid colon for evaluation of       microscopic colitis. The pathology specimen was placed into Bottle       Number 3.      Scattered medium-mouthed diverticula were found in the sigmoid colon.      External  hemorrhoids were found during retroflexion. The hemorrhoids       were small.      Anal papilla(e) were hypertrophied. Impression:               - Seven small polyps in the sigmoid colon, at the                            hepatic flexure, in the ascending colon and in the                            cecum, removed with a cold snare. Resected and                            retrieved.                           - One 10 mm polyp in the ascending colon, removed                            with a hot snare. Resected and retrieved.                           - One 12 mm polyp in the distal sigmoid colon,                            removed with a hot snare. Resected and retrieved.                           - Normal mucosa in the sigmoid colon. Biopsied.                           - Diverticulosis in the sigmoid colon.                           - External hemorrhoids.                           - Anal papilla(e) were hypertrophied. Moderate Sedation:      Moderate (conscious) sedation was administered by the endoscopy nurse       and supervised by the endoscopist. The following parameters were       monitored: oxygen saturation, heart rate, blood pressure,  CO2       capnography and response to care. Total physician intraservice time was       56 minutes. Recommendation:           - Patient has a contact number available for                            emergencies. The signs and symptoms of potential                            delayed complications were discussed with the                            patient. Return to normal activities tomorrow.                            Written discharge instructions were provided to the                            patient.                           - High fiber diet today.                           - Continue present medications.                           - Increase Dicyclomine to 10 mg po ac.                           - No aspirin, ibuprofen, naproxen, or other                             non-steroidal anti-inflammatory drugs for 7 days                            after polyp removal.                           - Await pathology results.                           - Repeat colonoscopy in 3 years for surveillance. Procedure Code(s):        --- Professional ---                           [phone], Colonoscopy, flexible; with removal of                            tumor(s), polyp(s), or other lesion(s) by snare                            technique                           02585, 58, Colonoscopy, flexible; with biopsy,  single or multiple                           99152, Moderate sedation services provided by the                            same physician or other qualified health care                            professional performing the diagnostic or                            therapeutic service that the sedation supports,                            requiring the presence of an independent trained                            observer to assist in the monitoring of the                            patient's level of consciousness and physiological                            status; initial 15 minutes of intraservice time,                            patient age 22 years or older                           [phone], Moderate sedation services; each additional                            15 minutes intraservice time                           99153, Moderate sedation services; each additional                            15 minutes intraservice time                           99153, Moderate sedation services; each additional                            15 minutes intraservice time Diagnosis Code(s):        --- Professional ---                           D12.5, Benign neoplasm of sigmoid colon                           D12.3, Benign neoplasm of transverse colon (hepatic                            flexure or splenic  flexure)                            D12.2, Benign neoplasm of ascending colon                           D12.0, Benign neoplasm of cecum                           K64.4, Residual hemorrhoidal skin tags                           K62.89, Other specified diseases of anus and rectum                           K52.9, Noninfective gastroenteritis and colitis,                            unspecified                           K57.30, Diverticulosis of large intestine without                            perforation or abscess without bleeding CPT copyright 2016 American Medical Association. All rights reserved. The codes documented in this report are preliminary and upon coder review may  be revised to meet current compliance requirements. Hildred Laser, MD Hildred Laser, MD 07/02/2017 8:49:10 AM This report has been signed electronically. Number of Addenda: 0

## 2017-07-02 NOTE — Discharge Instructions (Signed)
Irritable Bowel Syndrome, Adult Irritable bowel syndrome (IBS) is not one specific disease. It is a group of symptoms that affects the organs responsible for digestion (gastrointestinal or GI tract). To regulate how your GI tract works, your body sends signals back and forth between your intestines and your brain. If you have IBS, there may be a problem with these signals. As a result, your GI tract does not function normally. Your intestines may become more sensitive and overreact to certain things. This is especially true when you eat certain foods or when you are under stress. There are four types of IBS. These may be determined based on the consistency of your stool:  IBS with diarrhea.  IBS with constipation.  Mixed IBS.  Unsubtyped IBS.  It is important to know which type of IBS you have. Some treatments are more likely to be helpful for certain types of IBS. What are the causes? The exact cause of IBS is not known. What increases the risk? You may have a higher risk of IBS if:  You are a woman.  You are younger than 73 years old.  You have a family history of IBS.  You have mental health problems.  You have had bacterial infection of your GI tract.  What are the signs or symptoms? Symptoms of IBS vary from person to person. The main symptom is abdominal pain or discomfort. Additional symptoms usually include one or more of the following:  Diarrhea, constipation, or both.  Abdominal swelling or bloating.  Feeling full or sick after eating a small or regular-size meal.  Frequent gas.  Mucus in the stool.  A feeling of having more stool left after a bowel movement.  Symptoms tend to come and go. They may be associated with stress, psychiatric conditions, or nothing at all. How is this diagnosed? There is no specific test to diagnose IBS. Your health care provider will make a diagnosis based on a physical exam, medical history, and your symptoms. You may have other  tests to rule out other conditions that may be causing your symptoms. These may include:  Blood tests.  X-rays.  CT scan.  Endoscopy and colonoscopy. This is a test in which your GI tract is viewed with a long, thin, flexible tube.  How is this treated? There is no cure for IBS, but treatment can help relieve symptoms. IBS treatment often includes:  Changes to your diet, such as: ? Eating more fiber. ? Avoiding foods that cause symptoms. ? Drinking more water. ? Eating regular, medium-sized portioned meals.  Medicines. These may include: ? Fiber supplements if you have constipation. ? Medicine to control diarrhea (antidiarrheal medicines). ? Medicine to help control muscle spasms in your GI tract (antispasmodic medicines). ? Medicines to help with any mental health issues, such as antidepressants or tranquilizers.  Therapy. ? Talk therapy may help with anxiety, depression, or other mental health issues that can make IBS symptoms worse.  Stress reduction. ? Managing your stress can help keep symptoms under control.  Follow these instructions at home:  Take medicines only as directed by your health care provider.  Eat a healthy diet. ? Avoid foods and drinks with added sugar. ? Include more whole grains, fruits, and vegetables gradually into your diet. This may be especially helpful if you have IBS with constipation. ? Avoid any foods and drinks that make your symptoms worse. These may include dairy products and caffeinated or carbonated drinks. ? Do not eat large meals. ? Drink enough  fluid to keep your urine clear or pale yellow.  Exercise regularly. Ask your health care provider for recommendations of good activities for you.  Keep all follow-up visits as directed by your health care provider. This is important. Contact a health care provider if:  You have constant pain.  You have trouble or pain with swallowing.  You have worsening diarrhea. Get help right away  if:  You have severe and worsening abdominal pain.  You have diarrhea and: ? You have a rash, stiff neck, or severe headache. ? You are irritable, sleepy, or difficult to awaken. ? You are weak, dizzy, or extremely thirsty.  You have bright red blood in your stool or you have black tarry stools.  You have unusual abdominal swelling that is painful.  You vomit continuously.  You vomit blood (hematemesis).  You have both abdominal pain and a fever. This information is not intended to replace advice given to you by your health care provider. Make sure you discuss any questions you have with your health care provider. Document Released: 06/15/2005 Document Revised: 11/15/2015 Document Reviewed: 03/02/2014 Elsevier Interactive Patient Education  2018 [employer]. No aspirin or NSAIDs for 1 week. Increase dicyclomine to 10 mg by mouth 30 minutes before each meal. Resume other medications as before. High-fiber diet. No driving for 24 hours. Physician will call with biopsy results.

## 2017-07-06 ENCOUNTER — Encounter (HOSPITAL_COMMUNITY): Payer: Self-pay | Admitting: Internal Medicine

## 2017-07-21 DIAGNOSIS — F17210 Nicotine dependence, cigarettes, uncomplicated: Secondary | ICD-10-CM | POA: Diagnosis not present

## 2017-07-21 DIAGNOSIS — Z299 Encounter for prophylactic measures, unspecified: Secondary | ICD-10-CM | POA: Diagnosis not present

## 2017-07-21 DIAGNOSIS — Z6833 Body mass index (BMI) 33.0-33.9, adult: Secondary | ICD-10-CM | POA: Diagnosis not present

## 2017-07-21 DIAGNOSIS — K529 Noninfective gastroenteritis and colitis, unspecified: Secondary | ICD-10-CM | POA: Diagnosis not present

## 2017-07-21 DIAGNOSIS — R0789 Other chest pain: Secondary | ICD-10-CM | POA: Diagnosis not present

## 2017-07-21 DIAGNOSIS — R7989 Other specified abnormal findings of blood chemistry: Secondary | ICD-10-CM | POA: Diagnosis not present

## 2017-07-21 DIAGNOSIS — R079 Chest pain, unspecified: Secondary | ICD-10-CM | POA: Diagnosis not present

## 2017-07-21 DIAGNOSIS — R109 Unspecified abdominal pain: Secondary | ICD-10-CM | POA: Diagnosis not present

## 2017-07-21 DIAGNOSIS — R0902 Hypoxemia: Secondary | ICD-10-CM | POA: Diagnosis not present

## 2017-07-21 DIAGNOSIS — J441 Chronic obstructive pulmonary disease with (acute) exacerbation: Secondary | ICD-10-CM | POA: Diagnosis not present

## 2017-07-22 DIAGNOSIS — K529 Noninfective gastroenteritis and colitis, unspecified: Secondary | ICD-10-CM | POA: Diagnosis not present

## 2017-07-22 DIAGNOSIS — R0789 Other chest pain: Secondary | ICD-10-CM | POA: Diagnosis not present

## 2017-07-22 DIAGNOSIS — R0902 Hypoxemia: Secondary | ICD-10-CM | POA: Diagnosis not present

## 2017-07-22 DIAGNOSIS — J441 Chronic obstructive pulmonary disease with (acute) exacerbation: Secondary | ICD-10-CM | POA: Diagnosis not present

## 2017-07-23 DIAGNOSIS — R0902 Hypoxemia: Secondary | ICD-10-CM | POA: Diagnosis present

## 2017-07-23 DIAGNOSIS — J441 Chronic obstructive pulmonary disease with (acute) exacerbation: Secondary | ICD-10-CM | POA: Diagnosis present

## 2017-07-23 DIAGNOSIS — R0789 Other chest pain: Secondary | ICD-10-CM | POA: Diagnosis present

## 2017-07-23 DIAGNOSIS — E876 Hypokalemia: Secondary | ICD-10-CM | POA: Diagnosis present

## 2017-07-23 DIAGNOSIS — K529 Noninfective gastroenteritis and colitis, unspecified: Secondary | ICD-10-CM | POA: Diagnosis present

## 2017-07-23 DIAGNOSIS — F17200 Nicotine dependence, unspecified, uncomplicated: Secondary | ICD-10-CM | POA: Diagnosis present

## 2017-07-23 DIAGNOSIS — R7989 Other specified abnormal findings of blood chemistry: Secondary | ICD-10-CM | POA: Diagnosis present

## 2017-07-23 DIAGNOSIS — R51 Headache: Secondary | ICD-10-CM | POA: Diagnosis present

## 2017-07-30 DIAGNOSIS — J449 Chronic obstructive pulmonary disease, unspecified: Secondary | ICD-10-CM | POA: Diagnosis not present

## 2017-07-30 DIAGNOSIS — E7800 Pure hypercholesterolemia, unspecified: Secondary | ICD-10-CM | POA: Diagnosis not present

## 2017-07-30 DIAGNOSIS — K529 Noninfective gastroenteritis and colitis, unspecified: Secondary | ICD-10-CM | POA: Diagnosis not present

## 2017-07-30 DIAGNOSIS — Z299 Encounter for prophylactic measures, unspecified: Secondary | ICD-10-CM | POA: Diagnosis not present

## 2017-07-30 DIAGNOSIS — Z09 Encounter for follow-up examination after completed treatment for conditions other than malignant neoplasm: Secondary | ICD-10-CM | POA: Diagnosis not present

## 2017-07-30 DIAGNOSIS — Z6833 Body mass index (BMI) 33.0-33.9, adult: Secondary | ICD-10-CM | POA: Diagnosis not present

## 2017-09-06 DIAGNOSIS — Z6833 Body mass index (BMI) 33.0-33.9, adult: Secondary | ICD-10-CM | POA: Diagnosis not present

## 2017-09-06 DIAGNOSIS — Z713 Dietary counseling and surveillance: Secondary | ICD-10-CM | POA: Diagnosis not present

## 2017-09-06 DIAGNOSIS — G4700 Insomnia, unspecified: Secondary | ICD-10-CM | POA: Diagnosis not present

## 2017-09-06 DIAGNOSIS — F17210 Nicotine dependence, cigarettes, uncomplicated: Secondary | ICD-10-CM | POA: Diagnosis not present

## 2017-09-06 DIAGNOSIS — Z299 Encounter for prophylactic measures, unspecified: Secondary | ICD-10-CM | POA: Diagnosis not present

## 2017-12-06 DIAGNOSIS — Z299 Encounter for prophylactic measures, unspecified: Secondary | ICD-10-CM | POA: Diagnosis not present

## 2017-12-06 DIAGNOSIS — E559 Vitamin D deficiency, unspecified: Secondary | ICD-10-CM | POA: Diagnosis not present

## 2017-12-06 DIAGNOSIS — G4700 Insomnia, unspecified: Secondary | ICD-10-CM | POA: Diagnosis not present

## 2017-12-06 DIAGNOSIS — Z6833 Body mass index (BMI) 33.0-33.9, adult: Secondary | ICD-10-CM | POA: Diagnosis not present

## 2017-12-06 DIAGNOSIS — R5383 Other fatigue: Secondary | ICD-10-CM | POA: Diagnosis not present

## 2017-12-06 DIAGNOSIS — J449 Chronic obstructive pulmonary disease, unspecified: Secondary | ICD-10-CM | POA: Diagnosis not present

## 2017-12-06 DIAGNOSIS — R252 Cramp and spasm: Secondary | ICD-10-CM | POA: Diagnosis not present

## 2018-01-27 DIAGNOSIS — L820 Inflamed seborrheic keratosis: Secondary | ICD-10-CM | POA: Diagnosis not present

## 2018-03-08 DIAGNOSIS — G4700 Insomnia, unspecified: Secondary | ICD-10-CM | POA: Diagnosis not present

## 2018-03-08 DIAGNOSIS — Z6834 Body mass index (BMI) 34.0-34.9, adult: Secondary | ICD-10-CM | POA: Diagnosis not present

## 2018-03-08 DIAGNOSIS — E669 Obesity, unspecified: Secondary | ICD-10-CM | POA: Diagnosis not present

## 2018-03-08 DIAGNOSIS — R609 Edema, unspecified: Secondary | ICD-10-CM | POA: Diagnosis not present

## 2018-03-08 DIAGNOSIS — J449 Chronic obstructive pulmonary disease, unspecified: Secondary | ICD-10-CM | POA: Diagnosis not present

## 2018-03-08 DIAGNOSIS — Z299 Encounter for prophylactic measures, unspecified: Secondary | ICD-10-CM | POA: Diagnosis not present

## 2018-04-05 DIAGNOSIS — M25521 Pain in right elbow: Secondary | ICD-10-CM | POA: Diagnosis not present

## 2018-04-05 DIAGNOSIS — M7021 Olecranon bursitis, right elbow: Secondary | ICD-10-CM | POA: Diagnosis not present

## 2018-04-05 DIAGNOSIS — M25421 Effusion, right elbow: Secondary | ICD-10-CM | POA: Diagnosis not present

## 2018-04-07 DIAGNOSIS — Z6833 Body mass index (BMI) 33.0-33.9, adult: Secondary | ICD-10-CM | POA: Diagnosis not present

## 2018-04-07 DIAGNOSIS — Z299 Encounter for prophylactic measures, unspecified: Secondary | ICD-10-CM | POA: Diagnosis not present

## 2018-04-07 DIAGNOSIS — Z23 Encounter for immunization: Secondary | ICD-10-CM | POA: Diagnosis not present

## 2018-04-07 DIAGNOSIS — J449 Chronic obstructive pulmonary disease, unspecified: Secondary | ICD-10-CM | POA: Diagnosis not present

## 2018-04-07 DIAGNOSIS — E669 Obesity, unspecified: Secondary | ICD-10-CM | POA: Diagnosis not present

## 2018-04-07 DIAGNOSIS — E7800 Pure hypercholesterolemia, unspecified: Secondary | ICD-10-CM | POA: Diagnosis not present

## 2018-05-05 DIAGNOSIS — Z6833 Body mass index (BMI) 33.0-33.9, adult: Secondary | ICD-10-CM | POA: Diagnosis not present

## 2018-05-05 DIAGNOSIS — E669 Obesity, unspecified: Secondary | ICD-10-CM | POA: Diagnosis not present

## 2018-05-05 DIAGNOSIS — J069 Acute upper respiratory infection, unspecified: Secondary | ICD-10-CM | POA: Diagnosis not present

## 2018-05-05 DIAGNOSIS — Z299 Encounter for prophylactic measures, unspecified: Secondary | ICD-10-CM | POA: Diagnosis not present

## 2018-05-05 DIAGNOSIS — J449 Chronic obstructive pulmonary disease, unspecified: Secondary | ICD-10-CM | POA: Diagnosis not present

## 2018-06-03 DIAGNOSIS — Z79899 Other long term (current) drug therapy: Secondary | ICD-10-CM | POA: Diagnosis not present

## 2018-06-03 DIAGNOSIS — Z1211 Encounter for screening for malignant neoplasm of colon: Secondary | ICD-10-CM | POA: Diagnosis not present

## 2018-06-03 DIAGNOSIS — Z0000 Encounter for general adult medical examination without abnormal findings: Secondary | ICD-10-CM | POA: Diagnosis not present

## 2018-06-03 DIAGNOSIS — E785 Hyperlipidemia, unspecified: Secondary | ICD-10-CM | POA: Diagnosis not present

## 2018-06-03 DIAGNOSIS — Z299 Encounter for prophylactic measures, unspecified: Secondary | ICD-10-CM | POA: Diagnosis not present

## 2018-06-03 DIAGNOSIS — Z1339 Encounter for screening examination for other mental health and behavioral disorders: Secondary | ICD-10-CM | POA: Diagnosis not present

## 2018-06-03 DIAGNOSIS — R5383 Other fatigue: Secondary | ICD-10-CM | POA: Diagnosis not present

## 2018-06-03 DIAGNOSIS — Z7189 Other specified counseling: Secondary | ICD-10-CM | POA: Diagnosis not present

## 2018-06-03 DIAGNOSIS — Z1331 Encounter for screening for depression: Secondary | ICD-10-CM | POA: Diagnosis not present

## 2018-06-03 DIAGNOSIS — Z6832 Body mass index (BMI) 32.0-32.9, adult: Secondary | ICD-10-CM | POA: Diagnosis not present

## 2018-06-03 DIAGNOSIS — J449 Chronic obstructive pulmonary disease, unspecified: Secondary | ICD-10-CM | POA: Diagnosis not present

## 2018-07-04 DIAGNOSIS — Z1231 Encounter for screening mammogram for malignant neoplasm of breast: Secondary | ICD-10-CM | POA: Diagnosis not present

## 2018-07-19 DIAGNOSIS — M25521 Pain in right elbow: Secondary | ICD-10-CM | POA: Diagnosis not present

## 2018-07-19 DIAGNOSIS — M7021 Olecranon bursitis, right elbow: Secondary | ICD-10-CM | POA: Diagnosis not present

## 2018-07-19 DIAGNOSIS — M25421 Effusion, right elbow: Secondary | ICD-10-CM | POA: Diagnosis not present

## 2018-07-29 DIAGNOSIS — E7800 Pure hypercholesterolemia, unspecified: Secondary | ICD-10-CM | POA: Diagnosis not present

## 2018-07-29 DIAGNOSIS — J449 Chronic obstructive pulmonary disease, unspecified: Secondary | ICD-10-CM | POA: Diagnosis not present

## 2018-08-23 DIAGNOSIS — M7021 Olecranon bursitis, right elbow: Secondary | ICD-10-CM | POA: Diagnosis not present

## 2018-08-30 DIAGNOSIS — E7800 Pure hypercholesterolemia, unspecified: Secondary | ICD-10-CM | POA: Diagnosis not present

## 2018-08-30 DIAGNOSIS — F17210 Nicotine dependence, cigarettes, uncomplicated: Secondary | ICD-10-CM | POA: Diagnosis not present

## 2018-08-30 DIAGNOSIS — Z6833 Body mass index (BMI) 33.0-33.9, adult: Secondary | ICD-10-CM | POA: Diagnosis not present

## 2018-08-30 DIAGNOSIS — G4700 Insomnia, unspecified: Secondary | ICD-10-CM | POA: Diagnosis not present

## 2018-08-30 DIAGNOSIS — Z299 Encounter for prophylactic measures, unspecified: Secondary | ICD-10-CM | POA: Diagnosis not present

## 2018-08-30 DIAGNOSIS — J449 Chronic obstructive pulmonary disease, unspecified: Secondary | ICD-10-CM | POA: Diagnosis not present

## 2018-09-09 DIAGNOSIS — M71521 Other bursitis, not elsewhere classified, right elbow: Secondary | ICD-10-CM | POA: Diagnosis not present

## 2018-09-09 DIAGNOSIS — M7021 Olecranon bursitis, right elbow: Secondary | ICD-10-CM | POA: Diagnosis not present

## 2018-09-20 DIAGNOSIS — M7021 Olecranon bursitis, right elbow: Secondary | ICD-10-CM | POA: Diagnosis not present

## 2018-09-27 DIAGNOSIS — M7021 Olecranon bursitis, right elbow: Secondary | ICD-10-CM | POA: Diagnosis not present

## 2018-10-18 DIAGNOSIS — M7021 Olecranon bursitis, right elbow: Secondary | ICD-10-CM | POA: Diagnosis not present

## 2018-10-25 DIAGNOSIS — M7021 Olecranon bursitis, right elbow: Secondary | ICD-10-CM | POA: Diagnosis not present

## 2018-11-08 DIAGNOSIS — M7021 Olecranon bursitis, right elbow: Secondary | ICD-10-CM | POA: Diagnosis not present

## 2018-11-14 DIAGNOSIS — J329 Chronic sinusitis, unspecified: Secondary | ICD-10-CM | POA: Diagnosis not present

## 2018-11-14 DIAGNOSIS — Z6833 Body mass index (BMI) 33.0-33.9, adult: Secondary | ICD-10-CM | POA: Diagnosis not present

## 2018-11-14 DIAGNOSIS — J449 Chronic obstructive pulmonary disease, unspecified: Secondary | ICD-10-CM | POA: Diagnosis not present

## 2018-11-14 DIAGNOSIS — E7800 Pure hypercholesterolemia, unspecified: Secondary | ICD-10-CM | POA: Diagnosis not present

## 2018-11-14 DIAGNOSIS — Z299 Encounter for prophylactic measures, unspecified: Secondary | ICD-10-CM | POA: Diagnosis not present

## 2018-11-29 DIAGNOSIS — M1712 Unilateral primary osteoarthritis, left knee: Secondary | ICD-10-CM | POA: Diagnosis not present

## 2018-12-01 DIAGNOSIS — M7122 Synovial cyst of popliteal space [Baker], left knee: Secondary | ICD-10-CM | POA: Diagnosis not present

## 2019-01-31 DIAGNOSIS — M1712 Unilateral primary osteoarthritis, left knee: Secondary | ICD-10-CM | POA: Diagnosis not present

## 2019-02-03 DIAGNOSIS — Z01812 Encounter for preprocedural laboratory examination: Secondary | ICD-10-CM | POA: Diagnosis not present

## 2019-02-17 DIAGNOSIS — M25562 Pain in left knee: Secondary | ICD-10-CM | POA: Diagnosis not present

## 2019-02-17 DIAGNOSIS — M79605 Pain in left leg: Secondary | ICD-10-CM | POA: Diagnosis not present

## 2019-02-17 DIAGNOSIS — S8992XA Unspecified injury of left lower leg, initial encounter: Secondary | ICD-10-CM | POA: Diagnosis not present

## 2019-02-17 DIAGNOSIS — S83242A Other tear of medial meniscus, current injury, left knee, initial encounter: Secondary | ICD-10-CM | POA: Diagnosis not present

## 2019-02-22 DIAGNOSIS — E785 Hyperlipidemia, unspecified: Secondary | ICD-10-CM | POA: Diagnosis not present

## 2019-02-22 DIAGNOSIS — M25562 Pain in left knee: Secondary | ICD-10-CM | POA: Diagnosis not present

## 2019-02-22 DIAGNOSIS — J449 Chronic obstructive pulmonary disease, unspecified: Secondary | ICD-10-CM | POA: Diagnosis not present

## 2019-02-22 DIAGNOSIS — Z299 Encounter for prophylactic measures, unspecified: Secondary | ICD-10-CM | POA: Diagnosis not present

## 2019-02-22 DIAGNOSIS — Z6834 Body mass index (BMI) 34.0-34.9, adult: Secondary | ICD-10-CM | POA: Diagnosis not present

## 2019-03-08 ENCOUNTER — Encounter: Payer: Self-pay | Admitting: Physician Assistant

## 2019-03-08 DIAGNOSIS — I10 Essential (primary) hypertension: Secondary | ICD-10-CM | POA: Diagnosis present

## 2019-03-08 DIAGNOSIS — I671 Cerebral aneurysm, nonruptured: Secondary | ICD-10-CM | POA: Diagnosis present

## 2019-03-08 DIAGNOSIS — M1712 Unilateral primary osteoarthritis, left knee: Secondary | ICD-10-CM | POA: Diagnosis not present

## 2019-03-08 HISTORY — DX: Unilateral primary osteoarthritis, left knee: M17.12

## 2019-03-08 NOTE — H&P (Signed)
TOTAL KNEE ADMISSION H&P  Patient is being admitted for left total knee arthroplasty.  Subjective:  Chief Complaint:left knee pain.  HPI: Jill Braun, 73 y.o. female, has a history of pain and functional disability in the left knee due to arthritis and has failed non-surgical conservative treatments for greater than 12 weeks to includeNSAID's and/or analgesics, corticosteriod injections, viscosupplementation injections, flexibility and strengthening excercises, use of assistive devices, weight reduction as appropriate and activity modification.  Onset of symptoms was gradual, starting 10 years ago with gradually worsening course since that time. The patient noted prior procedures on the knee to include  arthroscopy and menisectomy on the left knee(s).  Patient currently rates pain in the left knee(s) at 10 out of 10 with activity. Patient has night pain, worsening of pain with activity and weight bearing, pain that interferes with activities of daily living, crepitus and joint swelling.  Patient has evidence of subchondral sclerosis, periarticular osteophytes and joint space narrowing by imaging studies.  There is no active infection.  Patient Active Problem List   Diagnosis Date Noted  . Middle cerebral artery aneurysm   . Essential hypertension   . Irritable bowel syndrome with diarrhea 06/01/2017  . GERD (gastroesophageal reflux disease) 06/02/2016  . COPD exacerbation ([place]) 06/02/2016   Past Medical History:  Diagnosis Date  . COPD (chronic obstructive pulmonary disease) ([place]) 06/02/2016  . Essential hypertension   . GERD (gastroesophageal reflux disease)   . History of cardiac catheterization    Normal coronary arteries 2009  . Hyperlipidemia   . Middle cerebral artery aneurysm    Left - [facility] 2005    Past Surgical History:  Procedure Laterality Date  . ABDOMINAL HYSTERECTOMY    . BIOPSY  07/02/2017   Procedure: BIOPSY;  Surgeon: Rogene Houston, MD;  Location: [facility] ENDO SU[facility];   Service: Endoscopy;;  colon  . CATARACT EXTRACTION W/PHACO Right 02/01/2014   Procedure: CATARACT EXTRACTION PHACO AND INTRAOCULAR LENS PLACEMENT (IOC);  Surgeon: Tonny Branch, MD;  Location: [facility] ORS;  Service: Ophthalmology;  Laterality: Right;  CDE 7.59  . CATARACT EXTRACTION W/PHACO Left 02/22/2014   Procedure: CATARACT EXTRACTION PHACO AND INTRAOCULAR LENS PLACEMENT (IOC);  Surgeon: Tonny Branch, MD;  Location: [facility] ORS;  Service: Ophthalmology;  Laterality: Left;  CDE 8.12  . Cerebral aneurysm surgery     2003 at [place]    . COLONOSCOPY N/A 07/02/2017   Procedure: COLONOSCOPY;  Surgeon: Rogene Houston, MD;  Location: [facility] END[facility] SU[facility];  Service: Endoscopy;  Laterality: N/A;  7:30  . POLYPECTOMY  07/02/2017   Procedure: POLYPECTOMY;  Surgeon: Rogene Houston, MD;  Location: [facility];  Service: Endoscopy;;  colon    No current facility-administered medications for this encounter.    Current Outpatient Medications  Medication Sig Dispense Refill Last Dose  . Albuterol Sulfate 108 (90 Base) MCG/ACT AEPB Inhale 1-2 puffs into the lungs every 6 (six) hours as needed (for shortness of breath/wheezing).    06/28/2017  . ANORO ELLIPTA 62.5-25 MCG/INH AEPB Inhale 1 puff into the lungs daily as needed. For shortness of breath/wheezing.  2 Past Week at Unknown time  . dicyclomine (BENTYL) 10 MG capsule Take 1 capsule (10 mg total) by mouth 3 (three) times daily before meals. 90 capsule 5   . esomeprazole (NEXIUM) 40 MG capsule Take 40 mg by mouth daily.    06/29/2017  . ibuprofen (ADVIL,MOTRIN) 200 MG tablet Take 2 tablets (400 mg total) by mouth 2 (two)  times daily as needed (for pain.). 30 tablet 0   . Multiple Vitamin (MULTIVITAMIN) tablet Take 1 tablet by mouth daily.   06/29/2017  . polycarbophil (FIBERCON) 625 MG tablet Take 1,250 mg by mouth daily.   06/29/2017  . zolpidem (AMBIEN) 5 MG tablet Take 5 mg by mouth at bedtime as needed for sleep.  1 06/29/2017   No Known Allergies  Social  History   Tobacco Use  . Smoking status: Current Every Day Smoker    Packs/day: 0.50    Years: 47.00    Pack years: 23.50    Types: Cigarettes  . Smokeless tobacco: Never Used  Substance Use Topics  . Alcohol use: No    Family History  Problem Relation Age of Onset  . Breast cancer Mother   . Heart attack Mother   . Heart attack Father   . Diabetes Mellitus II Father   . Colon cancer Neg Hx      Review of Systems  Constitutional: Negative.   HENT: Negative.   Eyes: Negative.   Respiratory:       Congested smokers cough from COPD  Cardiovascular: Negative.   Gastrointestinal: Negative.   Genitourinary: Negative.   Musculoskeletal: Positive for back pain and joint pain.  Skin: Negative.   Neurological: Negative.   Endo/Heme/Allergies: Negative.   Psychiatric/Behavioral: Negative.     Objective:  Physical Exam  Constitutional: She appears well-developed and well-nourished.  HENT:  Head: Normocephalic and atraumatic.  Mouth/Throat: Oropharynx is clear and moist.  Eyes: Pupils are equal, round, and reactive to light. Conjunctivae are normal.  Neck: Neck supple.  Cardiovascular: Normal rate and regular rhythm.  Respiratory: Effort normal and breath sounds normal.  GI: Soft. Bowel sounds are normal.    Vital signs in last 24 hours: Temp:  [97.6 F (36.4 C)] 97.6 F (36.4 C) (09/09 1400) Pulse Rate:  [85] 85 (09/09 1400) BP: (142)/(80) 142/80 (09/09 1400) SpO2:  [96 %] 96 % (09/09 1400) Weight:  [83.9 kg] 83.9 kg (09/09 1400)  Labs:   Estimated body mass index is 34.96 kg/m as calculated from the following:   Height as of this encounter: 5\' 1"  (1.549 m).   Weight as of this encounter: 83.9 kg.   Imaging Review Plain radiographs demonstrate severe degenerative joint disease of the left knee(s). The overall alignment issignificant varus. The bone quality appears to be good for age and reported activity level.      Assessment/Plan:  End stage  arthritis, left knee  Principal Problem:   Primary localized osteoarthritis of left knee Active Problems:   GERD (gastroesophageal reflux disease)   COPD exacerbation (HCC)   Irritable bowel syndrome with diarrhea   Middle cerebral artery aneurysm   Essential hypertension   The patient history, physical examination, clinical judgment of the provider and imaging studies are consistent with end stage degenerative joint disease of the left knee(s) and total knee arthroplasty is deemed medically necessary. The treatment options including medical management, injection therapy arthroscopy and arthroplasty were discussed at length. The risks and benefits of total knee arthroplasty were presented and reviewed. The risks due to aseptic loosening, infection, stiffness, patella tracking problems, thromboembolic complications and other imponderables were discussed. The patient acknowledged the explanation, agreed to proceed with the plan and consent was signed. Patient is being admitted for inpatient treatment for surgery, pain control, PT, OT, prophylactic antibiotics, VTE prophylaxis, progressive ambulation and ADL's and discharge planning. The patient is planning to be discharged home with home health  services  I spoke to Jill Braun concerning her left knee CAT scan that showed significant osteoarthritis with no definite meniscal tearing.  She continues to have significant pain in the knee that has not been responsive to conservative care.  Pain with weight bearing and activity, relieved by rest.  We have injected the knee with only temporary relief.  She does not have a Bakers cyst, but she has both anterior and posterior pain.  Of note is that she does have a history of a brain aneurysm and cannot have an MRI.  She is followed by Arsenio Katz, DNP, NP-AG, her primary care provider.  She had undergone right elbow olecranon bursectomy by me on September 09, 2018 which did well with anesthesia.  I told her with these  findings and her significant persistent pain that I would recommend that we proceed with left total knee replacement.  I have spoken to her daughter in detail about this as well.  Risks, complications and benefits of the surgery have been described to her in detail and she understands this completely.  We will plan on setting her up for this when she is ready to proceed.     Patient's anticipated LOS is less than 2 midnights, meeting these requirements: - Younger than 30 - Lives within 1 hour of care - Has a competent adult at home to recover with post-op recover - NO history of  - Chronic pain requiring opiods  - Diabetes  - Coronary Artery Disease  - Heart failure  - Heart attack  - Stroke  - DVT/VTE  - Cardiac arrhythmia  - Respiratory Failure/COPD  - Renal failure  - Anemia  - Advanced Liver disease

## 2019-03-09 ENCOUNTER — Other Ambulatory Visit (HOSPITAL_COMMUNITY): Payer: Self-pay | Admitting: *Deleted

## 2019-03-13 ENCOUNTER — Other Ambulatory Visit: Payer: Self-pay

## 2019-03-13 ENCOUNTER — Encounter (HOSPITAL_COMMUNITY)
Admission: RE | Admit: 2019-03-13 | Discharge: 2019-03-13 | Disposition: A | Discharge status: Home / Self Care | Payer: Medicare Other | Source: Ambulatory Visit | Attending: Orthopedic Surgery | Admitting: Orthopedic Surgery

## 2019-03-13 ENCOUNTER — Encounter (HOSPITAL_COMMUNITY): Payer: Self-pay

## 2019-03-13 ENCOUNTER — Encounter (INDEPENDENT_AMBULATORY_CARE_PROVIDER_SITE_OTHER): Payer: Self-pay

## 2019-03-13 ENCOUNTER — Ambulatory Visit (HOSPITAL_COMMUNITY)
Admission: RE | Admit: 2019-03-13 | Discharge: 2019-03-13 | Disposition: A | Discharge status: Home / Self Care | Payer: Medicare Other | Source: Ambulatory Visit | Attending: Physician Assistant | Admitting: Physician Assistant

## 2019-03-13 DIAGNOSIS — I671 Cerebral aneurysm, nonruptured: Secondary | ICD-10-CM | POA: Insufficient documentation

## 2019-03-13 DIAGNOSIS — Z79899 Other long term (current) drug therapy: Secondary | ICD-10-CM | POA: Diagnosis not present

## 2019-03-13 DIAGNOSIS — J449 Chronic obstructive pulmonary disease, unspecified: Secondary | ICD-10-CM | POA: Diagnosis not present

## 2019-03-13 DIAGNOSIS — M1712 Unilateral primary osteoarthritis, left knee: Secondary | ICD-10-CM | POA: Diagnosis not present

## 2019-03-13 DIAGNOSIS — F17210 Nicotine dependence, cigarettes, uncomplicated: Secondary | ICD-10-CM | POA: Insufficient documentation

## 2019-03-13 DIAGNOSIS — J9811 Atelectasis: Secondary | ICD-10-CM | POA: Diagnosis not present

## 2019-03-13 DIAGNOSIS — Z01811 Encounter for preprocedural respiratory examination: Secondary | ICD-10-CM

## 2019-03-13 DIAGNOSIS — K219 Gastro-esophageal reflux disease without esophagitis: Secondary | ICD-10-CM | POA: Insufficient documentation

## 2019-03-13 DIAGNOSIS — Z01818 Encounter for other preprocedural examination: Secondary | ICD-10-CM | POA: Insufficient documentation

## 2019-03-13 DIAGNOSIS — I10 Essential (primary) hypertension: Secondary | ICD-10-CM | POA: Diagnosis not present

## 2019-03-13 HISTORY — DX: Headache, unspecified: R51.9

## 2019-03-13 LAB — COMPREHENSIVE METABOLIC PANEL
ALT: 18 U/L (ref 0–44)
AST: 17 U/L (ref 15–41)
Albumin: 4.1 g/dL (ref 3.5–5.0)
Alkaline Phosphatase: 76 U/L (ref 38–126)
Anion gap: 8 (ref 5–15)
BUN: 9 mg/dL (ref 8–23)
CO2: 26 mmol/L (ref 22–32)
Calcium: 9.1 mg/dL (ref 8.9–10.3)
Chloride: 107 mmol/L (ref 98–111)
Creatinine, Ser: 0.88 mg/dL (ref 0.44–1.00)
GFR calc Af Amer: >60 mL/min (ref >60)
GFR calc non Af Amer: >60 mL/min (ref >60)
Glucose, Bld: 106 mg/dL — ABNORMAL HIGH (ref 70–99)
Potassium: 4.2 mmol/L (ref 3.5–5.1)
Sodium: 141 mmol/L (ref 135–145)
Total Bilirubin: 0.7 mg/dL (ref 0.3–1.2)
Total Protein: 7.1 g/dL (ref 6.5–8.1)

## 2019-03-13 LAB — CBC WITH DIFFERENTIAL/PLATELET
Abs Immature Granulocytes: 0.04 10*3/uL (ref 0.00–0.07)
Basophils Absolute: 0.1 10*3/uL (ref 0.0–0.1)
Basophils Relative: 1 %
Eosinophils Absolute: 0.3 10*3/uL (ref 0.0–0.5)
Eosinophils Relative: 3 %
HCT: 46.1 % — ABNORMAL HIGH (ref 36.0–46.0)
Hemoglobin: 14.9 g/dL (ref 12.0–15.0)
Immature Granulocytes: 1 %
Lymphocytes Relative: 36 %
Lymphs Abs: 3 10*3/uL (ref 0.7–4.0)
MCH: 30.2 pg (ref 26.0–34.0)
MCHC: 32.3 g/dL (ref 30.0–36.0)
MCV: 93.3 fL (ref 80.0–100.0)
Monocytes Absolute: 0.7 10*3/uL (ref 0.1–1.0)
Monocytes Relative: 9 %
Neutro Abs: 4.3 10*3/uL (ref 1.7–7.7)
Neutrophils Relative %: 50 %
Platelets: 220 10*3/uL (ref 150–400)
RBC: 4.94 MIL/uL (ref 3.87–5.11)
RDW: 12.9 % (ref 11.5–15.5)
WBC: 8.5 10*3/uL (ref 4.0–10.5)
nRBC: 0 % (ref 0.0–0.2)

## 2019-03-13 LAB — PROTIME-INR
INR: 0.9 (ref 0.8–1.2)
Prothrombin Time: 12.4 seconds (ref 11.4–15.2)

## 2019-03-13 LAB — APTT: aPTT: 30 seconds (ref 24–36)

## 2019-03-13 LAB — SURGICAL PCR SCREEN
MRSA, PCR: NEGATIVE
Staphylococcus aureus: NEGATIVE

## 2019-03-13 NOTE — Patient Instructions (Signed)
DUE TO COVID-19 ONLY ONE VISITOR IS ALLOWED TO COME WITH YOU AND STAY IN THE WAITING ROOM ONLY DURING PRE OP AND PROCEDURE DAY OF SURGERY. THE 1 VISITOR MAY VISIT WITH YOU AFTER SURGERY IN YOUR PRIVATE ROOM DURING VISITING HOURS ONLY!  YOU NEED TO HAVE A COVID 19 TEST ON_ 03-16-2019 AT [place] SHORT STAY ENTRANCE AT 300 PM.  ONCE YOUR COVID TEST IS COMPLETED, PLEASE BEGIN THE QUARANTINE INSTRUCTIONS AS OUTLINED IN YOUR HANDOUT.                Jill Braun    Your procedure is scheduled on: 03-20-2019   Report to [facility] Main  Entrance   Report to  [place] PM  Call this number if you have problems the morning of surgery [phone]    Remember: [place], NO CHEWING GUM [place].   NO SOLID FOOD AFTER MIDNIGHT THE NIGHT PRIOR TO SURGERY. NOTHING BY MOUTH EXCEPT CLEAR LIQUIDS UNTIL 415 AM . PLEASE FINISH ENSURE DRINK PER SURGEON ORDER  WHICH NEEDS TO BE COMPLETED AT  415 AM.   CLEAR LIQUID DIET   Foods Allowed                                                                     Foods Excluded  Coffee and tea, regular and decaf                             liquids that you cannot  Plain Jell-O any favor except red or purple                                           see through such as: Fruit ices (not with fruit pulp)                                     milk, soups, orange juice  Iced Popsicles                                    All solid food Carbonated beverages, regular and diet                                    Cranberry, grape and apple juices Sports drinks like Gatorade Lightly seasoned clear broth or consume(fat free) Sugar, honey syrup  Sample Menu Breakfast                                Lunch                                     Supper Cranberry juice  Beef broth                            Chicken broth Jell-O                                     Grape juice                            Apple juice Coffee or tea                        Jell-O                                      Popsicle                                                Coffee or tea                        Coffee or tea  _____________________________________________________________________     Take these medicines the morning of surgery with A SIP OF WATER: ALBUTEROL INHALER IF NEEDED ANDBRING INHALER, NEXIUM  DO NOT TAKE ANY DIABETIC MEDICATIONS DAY OF YOUR SURGERY                               You may not have any metal on your body including hair pins and              piercings  Do not wear jewelry, make-up, lotions, powders or perfumes, deodorant             Do not wear nail polish.  Do not shave  48 hours prior to surgery.               Do not bring valuables to the hospital. [place].  Contacts, dentures or bridgework may not be worn into surgery.  Leave suitcase in the car. After surgery it may be brought to your room.   _____________________________________________________________________             [facility] - Preparing for Surgery Before surgery, you can play an important role.  Because skin is not sterile, your skin needs to be as free of germs as possible.  You can reduce the number of germs on your skin by washing with CHG (chlorahexidine gluconate) soap before surgery.  CHG is an antiseptic cleaner which kills germs and bonds with the skin to continue killing germs even after washing. Please DO NOT use if you have an allergy to CHG or antibacterial soaps.  If your skin becomes reddened/irritated stop using the CHG and inform your nurse when you arrive at Short Stay. Do not shave (including legs and underarms) for at least 48 hours prior to the first CHG shower.  You may shave your face/neck. Please follow these instructions carefully:  1.  Shower with CHG Soap the night before surgery  and the  morning of Surgery.  2.  If you  choose to wash your hair, wash your hair first as usual with your  normal  shampoo.  3.  After you shampoo, rinse your hair and body thoroughly to remove the  shampoo.                           4.  Use CHG as you would any other liquid soap.  You can apply chg directly  to the skin and wash                       Gently with a scrungie or clean washcloth.  5.  Apply the CHG Soap to your body ONLY FROM THE NECK DOWN.   Do not use on face/ open                           Wound or open sores. Avoid contact with eyes, ears mouth and genitals (private parts).                       Wash face,  Genitals (private parts) with your normal soap.             6.  Wash thoroughly, paying special attention to the area where your surgery  will be performed.  7.  Thoroughly rinse your body with warm water from the neck down.  8.  DO NOT shower/wash with your normal soap after using and rinsing off  the CHG Soap.                9.  Pat yourself dry with a clean towel.            10.  Wear clean pajamas.            11.  Place clean sheets on your bed the night of your first shower and do not  sleep with pets. Day of Surgery : Do not apply any lotions/deodorants the morning of surgery.  Please wear clean clothes to the hospital/surgery center.  FAILURE TO FOLLOW THESE INSTRUCTIONS MAY RESULT IN THE CANCELLATION OF YOUR SURGERY PATIENT SIGNATURE_________________________________  NURSE SIGNATURE__________________________________  ________________________________________________________________________   Jill Braun  An incentive spirometer is a tool that can help keep your lungs clear and active. This tool measures how well you are filling your lungs with each breath. Taking long deep breaths may help reverse or decrease the chance of developing breathing (pulmonary) problems (especially infection) following:  A long period of time when you are unable to move or be active. BEFORE THE PROCEDURE   If  the spirometer includes an indicator to show your best effort, your nurse or respiratory therapist will set it to a desired goal.  If possible, sit up straight or lean slightly forward. Try not to slouch.  Hold the incentive spirometer in an upright position. INSTRUCTIONS FOR USE  1. Sit on the edge of your bed if possible, or sit up as far as you can in bed or on a chair. 2. Hold the incentive spirometer in an upright position. 3. Breathe out normally. 4. Place the mouthpiece in your mouth and seal your lips tightly around it. 5. Breathe in slowly and as deeply as possible, raising the piston or the ball toward the top of the column. 6. Hold your breath for 3-5 seconds or for  as long as possible. Allow the piston or ball to fall to the bottom of the column. 7. Remove the mouthpiece from your mouth and breathe out normally. 8. Rest for a few seconds and repeat Steps 1 through 7 at least 10 times every 1-2 hours when you are awake. Take your time and take a few normal breaths between deep breaths. 9. The spirometer may include an indicator to show your best effort. Use the indicator as a goal to work toward during each repetition. 10. After each set of 10 deep breaths, practice coughing to be sure your lungs are clear. If you have an incision (the cut made at the time of surgery), support your incision when coughing by placing a pillow or rolled up towels firmly against it. Once you are able to get out of bed, walk around indoors and cough well. You may stop using the incentive spirometer when instructed by your caregiver.  RISKS AND COMPLICATIONS  Take your time so you do not get dizzy or light-headed.  If you are in pain, you may need to take or ask for pain medication before doing incentive spirometry. It is harder to take a deep breath if you are having pain. AFTER USE  Rest and breathe slowly and easily.  It can be helpful to keep track of a log of your progress. Your caregiver can  provide you with a simple table to help with this. If you are using the spirometer at home, follow these instructions: [place] IF:   You are having difficultly using the spirometer.  You have trouble using the spirometer as often as instructed.  Your pain medication is not giving enough relief while using the spirometer.  You develop fever of 100.5 F (38.1 C) or higher. SEEK IMMEDIATE MEDICAL CARE IF:   You cough up bloody sputum that had not been present before.  You develop fever of 102 F (38.9 C) or greater.  You develop worsening pain at or near the incision site. MAKE SURE YOU:   Understand these instructions.  Will watch your condition.  Will get help right away if you are not doing well or get worse. Document Released: 10/26/2006 Document Revised: 09/07/2011 Document Reviewed: 12/27/2006 Exit[employer] Patient Information 2014 [place].   ________________________________________________________________________   Incentive Spirometer  An incentive spirometer is a tool that can help keep your lungs clear and active. This tool measures how well you are filling your lungs with each breath. Taking long deep breaths may help reverse or decrease the chance of developing breathing (pulmonary) problems (especially infection) following:  A long period of time when you are unable to move or be active. BEFORE THE PROCEDURE   If the spirometer includes an indicator to show your best effort, your nurse or respiratory therapist will set it to a desired goal.  If possible, sit up straight or lean slightly forward. Try not to slouch.  Hold the incentive spirometer in an upright position. INSTRUCTIONS FOR USE  11. Sit on the edge of your bed if possible, or sit up as far as you can in bed or on a chair. 12. Hold the incentive spirometer in an upright position. 13. Breathe out normally. 14. Place the mouthpiece in your mouth and seal your lips tightly around  it. 15. Breathe in slowly and as deeply as possible, raising the piston or the ball toward the top of the column. 16. Hold your breath for 3-5 seconds or for as long as possible. Allow  the piston or ball to fall to the bottom of the column. 17. Remove the mouthpiece from your mouth and breathe out normally. 18. Rest for a few seconds and repeat Steps 1 through 7 at least 10 times every 1-2 hours when you are awake. Take your time and take a few normal breaths between deep breaths. 19. The spirometer may include an indicator to show your best effort. Use the indicator as a goal to work toward during each repetition. 20. After each set of 10 deep breaths, practice coughing to be sure your lungs are clear. If you have an incision (the cut made at the time of surgery), support your incision when coughing by placing a pillow or rolled up towels firmly against it. Once you are able to get out of bed, walk around indoors and cough well. You may stop using the incentive spirometer when instructed by your caregiver.  RISKS AND COMPLICATIONS  Take your time so you do not get dizzy or light-headed.  If you are in pain, you may need to take or ask for pain medication before doing incentive spirometry. It is harder to take a deep breath if you are having pain. AFTER USE  Rest and breathe slowly and easily.  It can be helpful to keep track of a log of your progress. Your caregiver can provide you with a simple table to help with this. If you are using the spirometer at home, follow these instructions: [place] IF:   You are having difficultly using the spirometer.  You have trouble using the spirometer as often as instructed.  Your pain medication is not giving enough relief while using the spirometer.  You develop fever of 100.5 F (38.1 C) or higher. SEEK IMMEDIATE MEDICAL CARE IF:   You cough up bloody sputum that had not been present before.  You develop fever of 102 F (38.9 C) or  greater.  You develop worsening pain at or near the incision site. MAKE SURE YOU:   Understand these instructions.  Will watch your condition.  Will get help right away if you are not doing well or get worse. Document Released: 10/26/2006 Document Revised: 09/07/2011 Document Reviewed: 12/27/2006 [facility] Patient Information 2014 [place], [place].   ________________________________________________________________________

## 2019-03-13 NOTE — Progress Notes (Addendum)
PCP - dr Rennis Petty vyas Cardiologist - none  Chest x-ray - 03-13-19 epic EKG - 03-13-19 epic Stress Test - none ECHO - none Cardiac Cath - none  Sleep Study - none CPAP -   Fasting Blood Sugar -n/a  Checks Blood Sugar _____ times a day  Blood Thinner Instructions:none Aspirin Instructions:none Last Dose:  Anesthesia review: urine culture group b strep + done 03-13-19, chart to jessica zanetto pa for review  Patient denies shortness of breath, fever, cough and chest pain at PAT appointment   Patient verbalized understanding of instructions that were given to them at the PAT appointment. Patient was also instructed that they will need to review over the PAT instructions again at home before surgery.

## 2019-03-14 LAB — URINE CULTURE: Culture: 50000 — AB

## 2019-03-16 ENCOUNTER — Other Ambulatory Visit: Payer: Self-pay

## 2019-03-16 ENCOUNTER — Other Ambulatory Visit (HOSPITAL_COMMUNITY)
Admission: RE | Admit: 2019-03-16 | Discharge: 2019-03-16 | Disposition: A | Discharge status: Home / Self Care | Payer: Medicare Other | Source: Ambulatory Visit | Attending: Orthopedic Surgery | Admitting: Orthopedic Surgery

## 2019-03-16 ENCOUNTER — Other Ambulatory Visit: Payer: Self-pay | Admitting: Orthopedic Surgery

## 2019-03-16 DIAGNOSIS — Z20828 Contact with and (suspected) exposure to other viral communicable diseases: Secondary | ICD-10-CM | POA: Insufficient documentation

## 2019-03-16 DIAGNOSIS — Z01812 Encounter for preprocedural laboratory examination: Secondary | ICD-10-CM | POA: Insufficient documentation

## 2019-03-16 LAB — SARS CORONAVIRUS 2 (TAT 6-24 HRS): SARS Coronavirus 2: NEGATIVE

## 2019-03-16 NOTE — Care Plan (Signed)
Spoke with patient prior to surgery. She plans to discharge to home with family and HHPT. Requesting Verl Dicker, PT from [facility]. Referral made. Rolling walker and CPM ordered. Patient and MD in agreement with plan. Choice offered.    Ladell Heads, [place]

## 2019-03-19 MED ORDER — BUPIVACAINE LIPOSOME 1.3 % IJ SUSP
20.0000 mL | Freq: Once | INTRAMUSCULAR | Status: DC
  Filled 2019-03-19: qty 20

## 2019-03-19 NOTE — Anesthesia Preprocedure Evaluation (Addendum)
Anesthesia Evaluation  Patient identified by MRN, date of birth, ID band Patient awake    Reviewed: Allergy & Precautions, H&P , NPO status , Patient's Chart, lab work & pertinent test results  Airway Mallampati: II  TM Distance: >3 FB     Dental  (+) Implants, Dental Advisory Given   Pulmonary COPD, Current Smoker,    breath sounds clear to auscultation       Cardiovascular hypertension,  Rhythm:Regular Rate:Normal     Neuro/Psych  Headaches,    GI/Hepatic GERD  Controlled and Medicated,  Endo/Other    Renal/GU      Musculoskeletal  (+) Arthritis ,   Abdominal (+) + obese,   Peds  Hematology   Anesthesia Other Findings   Reproductive/Obstetrics                            Anesthesia Physical  Anesthesia Plan  ASA: III  Anesthesia Plan: Spinal   Post-op Pain Management:  [facility] for Post-op pain   Induction: Intravenous  PONV Risk Score and Plan: 2 and Ondansetron, Dexamethasone, Propofol infusion and Treatment may vary due to age or medical condition  Airway Management Planned: Natural Airway  Additional Equipment: None  Intra-op Plan:   Post-operative Plan:   Informed Consent: I have reviewed the patients History and Physical, chart, labs and discussed the procedure including the risks, benefits and alternatives for the proposed anesthesia with the patient or authorized representative who has indicated his/her understanding and acceptance.     Dental advisory given  Plan Discussed with: CRNA  Anesthesia Plan Comments:       Anesthesia Quick Evaluation

## 2019-03-20 ENCOUNTER — Observation Stay (HOSPITAL_COMMUNITY)
Admission: RE | Admit: 2019-03-20 | Discharge: 2019-03-21 | Disposition: A | Discharge status: Home / Self Care | Payer: Medicare Other | Source: Other Acute Inpatient Hospital | Attending: Orthopedic Surgery | Admitting: Orthopedic Surgery

## 2019-03-20 ENCOUNTER — Inpatient Hospital Stay (HOSPITAL_COMMUNITY): Payer: Medicare Other | Admitting: Anesthesiology

## 2019-03-20 ENCOUNTER — Encounter (HOSPITAL_COMMUNITY)
Admission: RE | Disposition: A | Discharge status: Home / Self Care | Payer: Self-pay | Source: Other Acute Inpatient Hospital | Attending: Orthopedic Surgery

## 2019-03-20 ENCOUNTER — Other Ambulatory Visit: Payer: Self-pay

## 2019-03-20 ENCOUNTER — Inpatient Hospital Stay (HOSPITAL_COMMUNITY): Payer: Medicare Other | Admitting: Physician Assistant

## 2019-03-20 ENCOUNTER — Encounter (HOSPITAL_COMMUNITY): Payer: Self-pay

## 2019-03-20 DIAGNOSIS — Z79899 Other long term (current) drug therapy: Secondary | ICD-10-CM | POA: Insufficient documentation

## 2019-03-20 DIAGNOSIS — M2540 Effusion, unspecified joint: Secondary | ICD-10-CM | POA: Diagnosis not present

## 2019-03-20 DIAGNOSIS — Z90710 Acquired absence of both cervix and uterus: Secondary | ICD-10-CM | POA: Insufficient documentation

## 2019-03-20 DIAGNOSIS — E669 Obesity, unspecified: Secondary | ICD-10-CM | POA: Insufficient documentation

## 2019-03-20 DIAGNOSIS — Z6835 Body mass index (BMI) 35.0-35.9, adult: Secondary | ICD-10-CM | POA: Diagnosis not present

## 2019-03-20 DIAGNOSIS — J441 Chronic obstructive pulmonary disease with (acute) exacerbation: Secondary | ICD-10-CM | POA: Diagnosis not present

## 2019-03-20 DIAGNOSIS — R51 Headache: Secondary | ICD-10-CM | POA: Insufficient documentation

## 2019-03-20 DIAGNOSIS — M1990 Unspecified osteoarthritis, unspecified site: Secondary | ICD-10-CM | POA: Diagnosis not present

## 2019-03-20 DIAGNOSIS — Z9049 Acquired absence of other specified parts of digestive tract: Secondary | ICD-10-CM | POA: Insufficient documentation

## 2019-03-20 DIAGNOSIS — Z9842 Cataract extraction status, left eye: Secondary | ICD-10-CM | POA: Insufficient documentation

## 2019-03-20 DIAGNOSIS — Z961 Presence of intraocular lens: Secondary | ICD-10-CM | POA: Diagnosis not present

## 2019-03-20 DIAGNOSIS — Z8249 Family history of ischemic heart disease and other diseases of the circulatory system: Secondary | ICD-10-CM | POA: Diagnosis not present

## 2019-03-20 DIAGNOSIS — M1712 Unilateral primary osteoarthritis, left knee: Principal | ICD-10-CM | POA: Diagnosis present

## 2019-03-20 DIAGNOSIS — I700 Atherosclerosis of aorta: Secondary | ICD-10-CM | POA: Insufficient documentation

## 2019-03-20 DIAGNOSIS — K580 Irritable bowel syndrome with diarrhea: Secondary | ICD-10-CM | POA: Diagnosis present

## 2019-03-20 DIAGNOSIS — Z833 Family history of diabetes mellitus: Secondary | ICD-10-CM | POA: Diagnosis not present

## 2019-03-20 DIAGNOSIS — K219 Gastro-esophageal reflux disease without esophagitis: Secondary | ICD-10-CM | POA: Diagnosis not present

## 2019-03-20 DIAGNOSIS — Z7951 Long term (current) use of inhaled steroids: Secondary | ICD-10-CM | POA: Insufficient documentation

## 2019-03-20 DIAGNOSIS — F17210 Nicotine dependence, cigarettes, uncomplicated: Secondary | ICD-10-CM | POA: Diagnosis not present

## 2019-03-20 DIAGNOSIS — Z9841 Cataract extraction status, right eye: Secondary | ICD-10-CM | POA: Insufficient documentation

## 2019-03-20 DIAGNOSIS — Z803 Family history of malignant neoplasm of breast: Secondary | ICD-10-CM | POA: Diagnosis not present

## 2019-03-20 DIAGNOSIS — E785 Hyperlipidemia, unspecified: Secondary | ICD-10-CM | POA: Insufficient documentation

## 2019-03-20 DIAGNOSIS — Z86010 Personal history of colonic polyps: Secondary | ICD-10-CM | POA: Insufficient documentation

## 2019-03-20 DIAGNOSIS — Z791 Long term (current) use of non-steroidal anti-inflammatories (NSAID): Secondary | ICD-10-CM | POA: Insufficient documentation

## 2019-03-20 DIAGNOSIS — I10 Essential (primary) hypertension: Secondary | ICD-10-CM | POA: Diagnosis not present

## 2019-03-20 DIAGNOSIS — I671 Cerebral aneurysm, nonruptured: Secondary | ICD-10-CM | POA: Diagnosis not present

## 2019-03-20 DIAGNOSIS — J449 Chronic obstructive pulmonary disease, unspecified: Secondary | ICD-10-CM | POA: Diagnosis not present

## 2019-03-20 DIAGNOSIS — Z7982 Long term (current) use of aspirin: Secondary | ICD-10-CM | POA: Insufficient documentation

## 2019-03-20 DIAGNOSIS — G8918 Other acute postprocedural pain: Secondary | ICD-10-CM | POA: Diagnosis not present

## 2019-03-20 HISTORY — PX: TOTAL KNEE ARTHROPLASTY: SHX125

## 2019-03-20 SURGERY — ARTHROPLASTY, KNEE, TOTAL
Anesthesia: Spinal | Site: Knee | Laterality: Left

## 2019-03-20 MED ORDER — ALBUTEROL SULFATE (2.5 MG/3ML) 0.083% IN NEBU: 2.5000 mg | INHALATION_SOLUTION | Freq: Four times a day (QID) | RESPIRATORY_TRACT | Status: DC | PRN

## 2019-03-20 MED ORDER — 0.9 % SODIUM CHLORIDE (POUR BTL) OPTIME
TOPICAL | Status: DC | PRN
  Administered 2019-03-20: 1000 mL

## 2019-03-20 MED ORDER — PROPOFOL 10 MG/ML IV BOLUS
INTRAVENOUS | Status: AC
  Filled 2019-03-20: qty 20

## 2019-03-20 MED ORDER — ONDANSETRON HCL 4 MG/2ML IJ SOLN: 4.0000 mg | Freq: Once | INTRAMUSCULAR | Status: DC | PRN

## 2019-03-20 MED ORDER — POVIDONE-IODINE 10 % EX SWAB: 2.0000 "application " | Freq: Once | CUTANEOUS | Status: DC

## 2019-03-20 MED ORDER — LACTATED RINGERS IV SOLN
INTRAVENOUS | Status: DC
  Administered 2019-03-20 (×2): via INTRAVENOUS

## 2019-03-20 MED ORDER — CLONIDINE HCL (ANALGESIA) 100 MCG/ML EP SOLN
EPIDURAL | Status: DC | PRN
  Administered 2019-03-20: 50 ug

## 2019-03-20 MED ORDER — POTASSIUM CHLORIDE IN NACL 20-0.9 MEQ/L-% IV SOLN
INTRAVENOUS | Status: DC
  Administered 2019-03-20: 11:00:00 via INTRAVENOUS
  Filled 2019-03-20 (×2): qty 1000

## 2019-03-20 MED ORDER — BUPIVACAINE HCL (PF) 0.25 % IJ SOLN
INTRAMUSCULAR | Status: DC | PRN
  Administered 2019-03-20: 30 mL

## 2019-03-20 MED ORDER — POVIDONE-IODINE 7.5 % EX SOLN: Freq: Once | CUTANEOUS | Status: DC

## 2019-03-20 MED ORDER — DEXAMETHASONE SODIUM PHOSPHATE 10 MG/ML IJ SOLN
8.0000 mg | Freq: Once | INTRAMUSCULAR | Status: AC
  Administered 2019-03-20: 08:00:00 8 mg via INTRAVENOUS

## 2019-03-20 MED ORDER — MEPERIDINE HCL 50 MG/ML IJ SOLN: 6.2500 mg | INTRAMUSCULAR | Status: DC | PRN

## 2019-03-20 MED ORDER — SODIUM CHLORIDE 0.9 % IV SOLN
INTRAVENOUS | Status: AC | PRN
  Administered 2019-03-20: 1.5 g via INTRAVENOUS

## 2019-03-20 MED ORDER — ONDANSETRON HCL 4 MG/2ML IJ SOLN
INTRAMUSCULAR | Status: DC | PRN
  Administered 2019-03-20: 4 mg via INTRAVENOUS

## 2019-03-20 MED ORDER — ROPIVACAINE HCL 7.5 MG/ML IJ SOLN
INTRAMUSCULAR | Status: DC | PRN
  Administered 2019-03-20: 20 mL via PERINEURAL

## 2019-03-20 MED ORDER — CEFAZOLIN SODIUM-DEXTROSE 2-4 GM/100ML-% IV SOLN
2.0000 g | INTRAVENOUS | Status: AC
  Administered 2019-03-20: 2 g via INTRAVENOUS

## 2019-03-20 MED ORDER — ONDANSETRON HCL 4 MG/2ML IJ SOLN
INTRAMUSCULAR | Status: AC
  Filled 2019-03-20: qty 2

## 2019-03-20 MED ORDER — DEXAMETHASONE SODIUM PHOSPHATE 10 MG/ML IJ SOLN
10.0000 mg | Freq: Three times a day (TID) | INTRAMUSCULAR | Status: DC
  Administered 2019-03-20 – 2019-03-21 (×3): 10 mg via INTRAVENOUS
  Filled 2019-03-20 (×3): qty 1

## 2019-03-20 MED ORDER — OXYCODONE HCL 5 MG PO TABS
5.0000 mg | ORAL_TABLET | ORAL | Status: DC | PRN
  Administered 2019-03-20: 18:00:00 10 mg via ORAL
  Administered 2019-03-20: 12:00:00 5 mg via ORAL
  Administered 2019-03-20 – 2019-03-21 (×2): 10 mg via ORAL
  Filled 2019-03-20: qty 1
  Filled 2019-03-20 (×3): qty 2

## 2019-03-20 MED ORDER — METOCLOPRAMIDE HCL 5 MG/ML IJ SOLN: 5.0000 mg | Freq: Three times a day (TID) | INTRAMUSCULAR | Status: DC | PRN

## 2019-03-20 MED ORDER — FENTANYL CITRATE (PF) 100 MCG/2ML IJ SOLN
INTRAMUSCULAR | Status: AC
  Filled 2019-03-20: qty 2

## 2019-03-20 MED ORDER — MIDAZOLAM HCL 2 MG/2ML IJ SOLN
INTRAMUSCULAR | Status: AC
  Filled 2019-03-20: qty 2

## 2019-03-20 MED ORDER — CEFAZOLIN SODIUM-DEXTROSE 2-4 GM/100ML-% IV SOLN
INTRAVENOUS | Status: AC
  Filled 2019-03-20: qty 100

## 2019-03-20 MED ORDER — ALUM & MAG HYDROXIDE-SIMETH 200-200-20 MG/5ML PO SUSP: 30.0000 mL | ORAL | Status: DC | PRN

## 2019-03-20 MED ORDER — BUPIVACAINE LIPOSOME 1.3 % IJ SUSP
INTRAMUSCULAR | Status: DC | PRN
  Administered 2019-03-20: 20 mL

## 2019-03-20 MED ORDER — BUPIVACAINE IN DEXTROSE 0.75-8.25 % IT SOLN
INTRATHECAL | Status: DC | PRN
  Administered 2019-03-20: 1.6 mL via INTRATHECAL

## 2019-03-20 MED ORDER — MIDAZOLAM HCL 5 MG/5ML IJ SOLN
INTRAMUSCULAR | Status: DC | PRN
  Administered 2019-03-20: 1 mg via INTRAVENOUS

## 2019-03-20 MED ORDER — PHENOL 1.4 % MT LIQD: 1.0000 | OROMUCOSAL | Status: DC | PRN

## 2019-03-20 MED ORDER — PROPOFOL 10 MG/ML IV BOLUS
INTRAVENOUS | Status: AC
  Filled 2019-03-20: qty 40

## 2019-03-20 MED ORDER — SODIUM CHLORIDE (PF) 0.9 % IJ SOLN
INTRAMUSCULAR | Status: DC | PRN
  Administered 2019-03-20: 50 mL

## 2019-03-20 MED ORDER — ONDANSETRON HCL 4 MG PO TABS: 4.0000 mg | ORAL_TABLET | Freq: Four times a day (QID) | ORAL | Status: DC | PRN

## 2019-03-20 MED ORDER — BUPIVACAINE HCL (PF) 0.25 % IJ SOLN
INTRAMUSCULAR | Status: AC
  Filled 2019-03-20: qty 30

## 2019-03-20 MED ORDER — DEXAMETHASONE SODIUM PHOSPHATE 10 MG/ML IJ SOLN
INTRAMUSCULAR | Status: AC
  Filled 2019-03-20: qty 1

## 2019-03-20 MED ORDER — METOCLOPRAMIDE HCL 5 MG PO TABS: 5.0000 mg | ORAL_TABLET | Freq: Three times a day (TID) | ORAL | Status: DC | PRN

## 2019-03-20 MED ORDER — ASPIRIN EC 325 MG PO TBEC
325.0000 mg | DELAYED_RELEASE_TABLET | Freq: Every day | ORAL | Status: DC
  Administered 2019-03-21: 08:00:00 325 mg via ORAL
  Filled 2019-03-20: qty 1

## 2019-03-20 MED ORDER — SODIUM CHLORIDE (PF) 0.9 % IJ SOLN
INTRAMUSCULAR | Status: AC
  Filled 2019-03-20: qty 50

## 2019-03-20 MED ORDER — ACETAMINOPHEN 500 MG PO TABS
1000.0000 mg | ORAL_TABLET | Freq: Four times a day (QID) | ORAL | Status: AC
  Administered 2019-03-20 (×2): 1000 mg via ORAL
  Filled 2019-03-20 (×3): qty 2

## 2019-03-20 MED ORDER — FENTANYL CITRATE (PF) 100 MCG/2ML IJ SOLN
INTRAMUSCULAR | Status: DC | PRN
  Administered 2019-03-20: 50 ug via INTRAVENOUS

## 2019-03-20 MED ORDER — DOCUSATE SODIUM 100 MG PO CAPS
100.0000 mg | ORAL_CAPSULE | Freq: Two times a day (BID) | ORAL | Status: DC
  Administered 2019-03-20 – 2019-03-21 (×3): 100 mg via ORAL
  Filled 2019-03-20 (×3): qty 1

## 2019-03-20 MED ORDER — TRANEXAMIC ACID-NACL 1000-0.7 MG/100ML-% IV SOLN
INTRAVENOUS | Status: AC
  Filled 2019-03-20: qty 100

## 2019-03-20 MED ORDER — GABAPENTIN 300 MG PO CAPS
300.0000 mg | ORAL_CAPSULE | Freq: Every day | ORAL | Status: DC
  Administered 2019-03-20: 300 mg via ORAL
  Filled 2019-03-20: qty 1

## 2019-03-20 MED ORDER — TRANEXAMIC ACID-NACL 1000-0.7 MG/100ML-% IV SOLN
1000.0000 mg | INTRAVENOUS | Status: AC
  Administered 2019-03-20: 08:00:00 1000 mg via INTRAVENOUS

## 2019-03-20 MED ORDER — PROPOFOL 500 MG/50ML IV EMUL
INTRAVENOUS | Status: DC | PRN
  Administered 2019-03-20: 50 ug/kg/min via INTRAVENOUS

## 2019-03-20 MED ORDER — HYDROMORPHONE HCL 1 MG/ML IJ SOLN: 0.2500 mg | INTRAMUSCULAR | Status: DC | PRN

## 2019-03-20 MED ORDER — HYDROMORPHONE HCL 1 MG/ML IJ SOLN
0.5000 mg | INTRAMUSCULAR | Status: DC | PRN
  Administered 2019-03-20: 15:00:00 1 mg via INTRAVENOUS
  Filled 2019-03-20: qty 1

## 2019-03-20 MED ORDER — SODIUM CHLORIDE 0.9 % IR SOLN
Status: DC | PRN
  Administered 2019-03-20: 2000 mL

## 2019-03-20 MED ORDER — DIPHENHYDRAMINE HCL 12.5 MG/5ML PO ELIX: 12.5000 mg | ORAL_SOLUTION | ORAL | Status: DC | PRN

## 2019-03-20 MED ORDER — CHLORHEXIDINE GLUCONATE 4 % EX LIQD: 60.0000 mL | Freq: Once | CUTANEOUS | Status: DC

## 2019-03-20 MED ORDER — PROPOFOL 500 MG/50ML IV EMUL
INTRAVENOUS | Status: DC | PRN
  Administered 2019-03-20: 30 mg via INTRAVENOUS

## 2019-03-20 MED ORDER — POVIDONE-IODINE 10 % EX SWAB
2.0000 "application " | Freq: Once | CUTANEOUS | Status: AC
  Administered 2019-03-20: 2 via TOPICAL

## 2019-03-20 MED ORDER — PHENYLEPHRINE HCL (PRESSORS) 10 MG/ML IV SOLN
INTRAVENOUS | Status: AC
  Filled 2019-03-20: qty 1

## 2019-03-20 MED ORDER — ONDANSETRON HCL 4 MG/2ML IJ SOLN
4.0000 mg | Freq: Four times a day (QID) | INTRAMUSCULAR | Status: DC | PRN
  Administered 2019-03-20: 13:00:00 4 mg via INTRAVENOUS
  Filled 2019-03-20: qty 2

## 2019-03-20 MED ORDER — POLYETHYLENE GLYCOL 3350 17 G PO PACK
17.0000 g | PACK | Freq: Two times a day (BID) | ORAL | Status: DC
  Administered 2019-03-20 – 2019-03-21 (×3): 17 g via ORAL
  Filled 2019-03-20 (×3): qty 1

## 2019-03-20 MED ORDER — CEFAZOLIN SODIUM-DEXTROSE 2-4 GM/100ML-% IV SOLN
2.0000 g | Freq: Four times a day (QID) | INTRAVENOUS | Status: AC
  Administered 2019-03-20 (×2): 2 g via INTRAVENOUS
  Filled 2019-03-20 (×2): qty 100

## 2019-03-20 MED ORDER — MENTHOL 3 MG MT LOZG: 1.0000 | LOZENGE | OROMUCOSAL | Status: DC | PRN

## 2019-03-20 MED ORDER — SODIUM CHLORIDE 0.9 % IV SOLN
INTRAVENOUS | Status: DC | PRN
  Administered 2019-03-20: 08:00:00 25 ug/min via INTRAVENOUS

## 2019-03-20 SURGICAL SUPPLY — 71 items
APL PRP STRL LF DISP 70% ISPRP (MISCELLANEOUS) ×2
ATTUNE MED DOME PAT 32 KNEE (Knees) ×1 IMPLANT
ATTUNE MED DOME PAT 32MM KNEE (Knees) ×1 IMPLANT
ATTUNE PS FEM LT SZ 3 CEM KNEE (Femur) ×2 IMPLANT
ATTUNE PSRP INSR SZ3 6 KNEE (Insert) ×1 IMPLANT
ATTUNE PSRP INSR SZ3 6MM KNEE (Insert) ×1 IMPLANT
BAG SPEC THK2 15X12 ZIP CLS (MISCELLANEOUS) ×1
BAG ZIPLOCK 12X15 (MISCELLANEOUS) ×3 IMPLANT
BASEPLATE TIBIAL ROTATING SZ 4 (Knees) ×2 IMPLANT
BLADE HEX COATED 2.75 (ELECTRODE) ×3 IMPLANT
BLADE SAGITTAL 25.0X1.19X90 (BLADE) ×2 IMPLANT
BLADE SAGITTAL 25.0X1.19X90MM (BLADE) ×1
BLADE SAW SGTL 13X75X1.27 (BLADE) ×3 IMPLANT
BLADE SURG SZ10 CARB STEEL (BLADE) ×6 IMPLANT
BNDG CMPR MED 10X6 ELC LF (GAUZE/BANDAGES/DRESSINGS) ×1
BNDG ELASTIC 6X10 VLCR STRL LF (GAUZE/BANDAGES/DRESSINGS) ×3 IMPLANT
BOWL SMART MIX CTS (DISPOSABLE) ×3 IMPLANT
BSPLAT TIB 4 CMNT ROT PLAT STR (Knees) ×1 IMPLANT
CEMENT HV SMART SET (Cement) ×6 IMPLANT
CHLORAPREP W/TINT 26 (MISCELLANEOUS) ×6 IMPLANT
CLOSURE STERI-STRIP 1/2X4 (GAUZE/BANDAGES/DRESSINGS) ×1
CLOSURE WOUND 1/2 X4 (GAUZE/BANDAGES/DRESSINGS)
CLSR STERI-STRIP ANTIMIC 1/2X4 (GAUZE/BANDAGES/DRESSINGS) ×1 IMPLANT
COVER SURGICAL LIGHT HANDLE (MISCELLANEOUS) ×3 IMPLANT
COVER WAND RF STERILE (DRAPES) IMPLANT
CUFF TOURN SGL QUICK 34 (TOURNIQUET CUFF) ×3
CUFF TRNQT CYL 34X4.125X (TOURNIQUET CUFF) ×1 IMPLANT
DECANTER SPIKE VIAL GLASS SM (MISCELLANEOUS) ×6 IMPLANT
DRAPE ORTHO SPLIT 77X108 STRL (DRAPES) ×3
DRAPE SHEET LG 3/4 BI-LAMINATE (DRAPES) ×3 IMPLANT
DRAPE SURG ORHT 6 SPLT 77X108 (DRAPES) ×1 IMPLANT
DRAPE U-SHAPE 47X51 STRL (DRAPES) ×3 IMPLANT
DRESSING AQUACEL AG SP 3.5X10 (GAUZE/BANDAGES/DRESSINGS) IMPLANT
DRSG AQUACEL AG ADV 3.5X10 (GAUZE/BANDAGES/DRESSINGS) ×1 IMPLANT
DRSG AQUACEL AG SP 3.5X10 (GAUZE/BANDAGES/DRESSINGS) ×3
ELECT REM PT RETURN 15FT ADLT (MISCELLANEOUS) ×3 IMPLANT
FACESHIELD WRAPAROUND (MASK) ×3 IMPLANT
FACESHIELD WRAPAROUND OR TEAM (MASK) IMPLANT
GLOVE BIO SURGEON STRL SZ7 (GLOVE) ×3 IMPLANT
GLOVE BIOGEL PI IND STRL 7.0 (GLOVE) ×1 IMPLANT
GLOVE BIOGEL PI IND STRL 7.5 (GLOVE) ×1 IMPLANT
GLOVE BIOGEL PI INDICATOR 7.0 (GLOVE) ×2
GLOVE BIOGEL PI INDICATOR 7.5 (GLOVE) ×2
GLOVE SS BIOGEL STRL SZ 7.5 (GLOVE) ×1 IMPLANT
GLOVE SUPERSENSE BIOGEL SZ 7.5 (GLOVE) ×2
GOWN STRL REUS W/ TWL LRG LVL3 (GOWN DISPOSABLE) ×2 IMPLANT
GOWN STRL REUS W/TWL LRG LVL3 (GOWN DISPOSABLE) ×6
HANDPIECE INTERPULSE COAX TIP (DISPOSABLE) ×3
HOLDER FOLEY CATH W/STRAP (MISCELLANEOUS) ×2 IMPLANT
HOOD PEEL AWAY FLYTE STAYCOOL (MISCELLANEOUS) ×11 IMPLANT
KIT TURNOVER KIT A (KITS) ×3 IMPLANT
MANIFOLD NEPTUNE II (INSTRUMENTS) ×3 IMPLANT
MARKER SKIN DUAL TIP RULER LAB (MISCELLANEOUS) ×3 IMPLANT
NEEDLE HYPO 22GX1.5 SAFETY (NEEDLE) ×3 IMPLANT
NS IRRIG 1000ML POUR BTL (IV SOLUTION) ×1 IMPLANT
PACK TOTAL KNEE CUSTOM (KITS) ×3 IMPLANT
PIN STEINMAN FIXATION KNEE (PIN) ×2 IMPLANT
PROTECTOR NERVE ULNAR (MISCELLANEOUS) ×3 IMPLANT
SET HNDPC FAN SPRY TIP SCT (DISPOSABLE) ×1 IMPLANT
STAPLER VISISTAT 35W (STAPLE) IMPLANT
STRIP CLOSURE SKIN 1/2X4 (GAUZE/BANDAGES/DRESSINGS) ×1 IMPLANT
SUT MNCRL AB 3-0 PS2 18 (SUTURE) ×3 IMPLANT
SUT VIC AB 0 CT1 36 (SUTURE) ×6 IMPLANT
SUT VIC AB 1 CT1 36 (SUTURE) ×3 IMPLANT
SUT VIC AB 2-0 CT1 27 (SUTURE) ×6
SUT VIC AB 2-0 CT1 TAPERPNT 27 (SUTURE) ×2 IMPLANT
SYR CONTROL 10ML LL (SYRINGE) ×6 IMPLANT
TRAY FOLEY MTR SLVR 14FR STAT (SET/KITS/TRAYS/PACK) ×3 IMPLANT
WATER STERILE IRR 1000ML POUR (IV SOLUTION) ×6 IMPLANT
YANKAUER SUCT BULB TIP 10FT TU (MISCELLANEOUS) ×3 IMPLANT
YANKAUER SUCT BULB TIP NO VENT (SUCTIONS) ×3 IMPLANT

## 2019-03-20 NOTE — Op Note (Signed)
MRN:     [id number] DOB/AGE:    [birth year] / 73 y.o.       OPERATIVE REPORT   DATE OF PROCEDURE:  03/20/2019      PREOPERATIVE DIAGNOSIS:   Primary Localized Osteoarthritis left Knee       Estimated body mass index is 35.03 kg/m as calculated from the following:   Height as of this encounter: 5\' 1"  (1.549 m).   Weight as of this encounter: 84.1 kg.                                                       POSTOPERATIVE DIAGNOSIS:   Same                                                                 PROCEDURE:  Procedure(s): TOTAL KNEE ARTHROPLASTY Using Depuy Attune RP implants #3 Femur, #4Tibia, 36mm  RP bearing, 32 Patella    SURGEON: Fread Kottke A. Noemi Chapel, MD   ASSISTANT: Matthew Saras, PA-C, present and scrubbed throughout the case, critical for retraction, instrumentation, and closure.  ANESTHESIA: Spinal with Adductor Nerve Block  TOURNIQUET TIME: 48 minutes   COMPLICATIONS:  None       SPECIMENS: None   INDICATIONS FOR PROCEDURE: The patient has djd of the knee with varus deformities, XR shows bone on bone arthritis. Patient has failed all conservative measures including anti-inflammatory medicines, narcotics, attempts at exercise and weight loss, cortisone injections and viscosupplementation.  Risks and benefits of surgery have been discussed, questions answered.    DESCRIPTION OF PROCEDURE: The patient identified by armband, received right adductor canal block and IV antibiotics, in the holding area at [facility]. Patient taken to the operating room, appropriate anesthetic monitors were attached. Spinal anesthesia induced with the patient in supine position, Foley catheter was inserted. Tourniquet applied high to the operative thigh. Lateral post and foot positioner applied to the table, the lower extremity was then prepped and draped in usual sterile fashion from the ankle to the tourniquet. Time-out procedure was performed. The limb was wrapped with an Esmarch bandage  and the tourniquet inflated to 365 mmHg.   We began the operation by making a 6cm anterior midline incision. Small bleeders in the skin and the subcutaneous tissue identified and cauterized. Transverse retinaculum was incised and reflected medially and a medial parapatellar arthrotomy was accomplished. the patella was everted and theprepatellar fat pad resected. The superficial medial collateral ligament was then elevated from anterior to posterior along the proximal flare of the tibia and anterior half of the menisci resected. The knee was hyperflexed exposing bone on bone arthritis. Peripheral and notch osteophytes as well as the cruciate ligaments were then resected. We continued to work our way around posteriorly along the proximal tibia, and externally rotated the tibia subluxing it out from underneath the femur. A McHale retractor was placed through the notch and a lateral Hohmann retractor placed, and an external tibial guide was placed.  The tibial cutting guide was pinned into place allowing resection of 4 mm of bone medially and about 6 mm of bone laterally because of her  varus deformity.   Satisfied with the tibial resection, we then entered the distal femur 2 mm anterior to the PCL origin with the intramedullary guide rod and applied the distal femoral cutting guide set at 87mm, with 5 degrees of valgus. This was pinned along the epicondylar axis. At this point, the distal femoral cut was accomplished without difficulty. We then sized for a 3 femoral component and pinned the guide in 3 degrees of external rotation.The chamfer cutting guide was pinned into place. The anterior, posterior, and chamfer cuts were accomplished without difficulty followed by the  RP box cutting guide and the box cut. We also removed posterior osteophytes from the posterior femoral condyles. At this time, the knee was brought into full extension. We checked our extension and flexion gaps and found them symmetric at 6.  The  patella thickness measured at 28m m. We set the cutting guide at 15 and removed the posterior patella sized for 32 button and drilled the lollipop. The knee was then once again hyperflexed exposing the proximal tibia. We sized for a # 4 tibial base plate, applied the smokestack and the conical reamer followed by the the Delta fin keel punch. We then hammered into place the  RP trial femoral component, inserted a trial bearing, trial patellar button, and took the knee through range of motion from 0-130 degrees. No thumb pressure was required for patellar tracking.   At this point, all trial components were removed, a double batch of DePuy HV cement with Zinacef 1.5gms  was mixed and applied to all bony metallic mating surfaces. In order, we hammered into place the tibial tray and removed excess cement, the femoral component and removed excess cement, a 6 mm  RP bearing was inserted, and the knee brought to full extension with compression. The patellar button was clamped into place, and excess cement removed. While the cement cured the wound was irrigated out with normal saline solution pulse lavage, and exparel was injected throughout the knee. Ligament stability and patellar tracking were checked and found to be excellent..   The parapatellar arthrotomy was closed with  #1 Vicryl suture. The subcutaneous tissue with 0 and 2-0 undyed Vicryl suture, and 4-0 Monocryl.. A dressing of Aquaseal, 4 x 4, dressing sponges, Webril, and Ace wrap applied. Needle and sponge count were correct times 2.The patient awakened, extubated, and taken to recovery room without difficulty. Vascular status was normal, pulses 2+ and symmetric.    [name] 09/20/2017, 8:56 AM

## 2019-03-20 NOTE — Anesthesia Procedure Notes (Signed)
Spinal  Patient location during procedure: OR Start time: 03/20/2019 7:19 AM End time: 03/20/2019 7:24 AM Staffing Anesthesiologist: Nolon Nations, MD Resident/CRNA: Glory Buff, CRNA Performed: resident/CRNA  Preanesthetic Checklist Completed: patient identified, site marked, surgical consent, pre-op evaluation, timeout performed, IV checked, risks and benefits discussed and monitors and equipment checked Spinal Block Patient position: sitting Prep: DuraPrep Patient monitoring: heart rate, cardiac monitor, continuous pulse ox and blood pressure Approach: midline Location: L3-4 Injection technique: single-shot Needle Needle type: Pencan  Needle gauge: 24 G Needle length: 9 cm Assessment Sensory level: T6 Additional Notes Kit expiration date checked and padded.  Sterile prep and drape, skin local with 1% lidocaine, -heme, - paraesthesia, + CSF pre and post injection.  Patient tolerated procedure well.

## 2019-03-20 NOTE — Interval H&P Note (Signed)
History and Physical Interval Note:  03/20/2019 6:27 AM  Jill Braun  has presented today for surgery, with the diagnosis of djd left knee.  The various methods of treatment have been discussed with the patient and family. After consideration of risks, benefits and other options for treatment, the patient has consented to  Procedure(s): TOTAL KNEE ARTHROPLASTY (Left) as a surgical intervention.  The patient's history has been reviewed, patient examined, no change in status, stable for surgery.  I have reviewed the patient's chart and labs.  Questions were answered to the patient's satisfaction.     [name]

## 2019-03-20 NOTE — Anesthesia Postprocedure Evaluation (Signed)
Anesthesia Post Note  Patient: Jill Braun  Procedure(s) Performed: TOTAL KNEE ARTHROPLASTY (Left Knee)     Patient location during evaluation: PACU Anesthesia Type: Spinal Level of consciousness: sedated and patient cooperative Pain management: pain level controlled Vital Signs Assessment: post-procedure vital signs reviewed and stable Respiratory status: spontaneous breathing Cardiovascular status: stable Anesthetic complications: no    Last Vitals:  Vitals:   03/20/19 1137 03/20/19 1238  BP: (!) 114/59 122/68  Pulse: 73 88  Resp: 16 16  Temp: (!) 36.4 C (!) 36.3 C  SpO2: 97% 97%    Last Pain:  Vitals:   03/20/19 1238  TempSrc: Oral  PainSc:                  Nolon Nations

## 2019-03-20 NOTE — Evaluation (Signed)
Physical Therapy Evaluation Patient Details Name: Jill Braun MRN: [id number] DOB: Jul 06, 1945 Today's Date: 03/20/2019   History of Present Illness  Patient is 73 y.o. female s/p Lt TKA on 03/20/19 with PMH significant for OA, HLD, GERD, COPD, and history of cerebral aneurysm in 2003 and cardiac cath in 2009.    Clinical Impression  Jill Braun is a 73 y.o. female POD 0 s/p Lt TKA. Patient reports independence with mobility at baseline with no device and reports she is still working full time. Patient is now limited by functional impairments (see PT problem list below) and requires min assist for transfers and gait with RW. Patient was able to ambulate ~60 feet with RW and min assist and was limited by Lt knee pain. Patient instructed in exercise to facilitate ROM and circulation to manage edema. Patient will benefit from continued skilled PT interventions to address impairments and progress towards PLOF. Acute PT will follow to progress mobility and stair training in preparation for safe discharge home.     Follow Up Recommendations Follow surgeon's recommendation for DC plan and follow-up therapies    Equipment Recommendations       Recommendations for Other Services       Precautions / Restrictions Precautions Precautions: Fall Restrictions Weight Bearing Restrictions: No      Mobility  Bed Mobility Overal bed mobility: Needs Assistance Bed Mobility: Supine to Sit     Supine to sit: Supervision;HOB elevated     General bed mobility comments: HOB slightly elevated, no cues needed for safety  Transfers Overall transfer level: Needs assistance Equipment used: Rolling walker (2 wheeled) Transfers: Sit to/from Stand Sit to Stand: Min assist         General transfer comment: cues for safe hand placement with RW to improve power up by pushign up from bed, no physical assist required to initiate power up, min assist to steady upon rising  Ambulation/Gait Ambulation/Gait  assistance: Min guard Gait Distance (Feet): 60 Feet Assistive device: Rolling walker (2 wheeled) Gait Pattern/deviations: Step-through pattern;Antalgic;Decreased step length - left;Decreased stride length;Decreased stance time - left;Decreased weight shift to left Gait velocity: decreased   General Gait Details: pt reliant on UE support to unweight Lt LE secondary to pain, cues at start for safe hand placement and proximety to [employer] Mobility    Modified Rankin (Stroke Patients Only)       Balance Overall balance assessment: Needs assistance Sitting-balance support: Feet supported;No upper extremity supported Sitting balance-Leahy Scale: Good     Standing balance support: During functional activity;Bilateral upper extremity supported Standing balance-Leahy Scale: Fair Standing balance comment: pt requires UE support for dynamic standing with small LOB while donning face mask without UE support, UE's required for gait                  Pertinent Vitals/Pain Pain Assessment: 0-10 Pain Score: 4  Pain Location: Lt knee Pain Descriptors / Indicators: Aching;Sore Pain Intervention(s): Limited activity within patient's tolerance;Monitored during session;Ice applied    Home Living Family/patient expects to be discharged to:: Private residence Living Arrangements: Alone Available Help at Discharge: Family Type of Home: House Home Access: Stairs to enter Entrance Stairs-Rails: None Entrance Stairs-Number of Steps: 3 Home Layout: One level Home Equipment: Environmental consultant - 2 wheels;Cane - quad Additional Comments: patient's daughter is going to stay with her for 1 week, longer if needed, and then she has another daughter  who will take over assisting her if needed    Prior Function Level of Independence: Independent         Comments: patient was mobilizing prior to surgery with no device, states she was "hobbling"     Hand Dominance   Dominant  Hand: Right    Extremity/Trunk Assessment   Upper Extremity Assessment Upper Extremity Assessment: Overall WFL for tasks assessed    Lower Extremity Assessment Lower Extremity Assessment: Generalized weakness;LLE deficits/detail LLE Deficits / Details: pt abel to maintain good quad contraction throughout SLR, no buckling evident in WB    Cervical / Trunk Assessment Cervical / Trunk Assessment: Kyphotic  Communication   Communication: No difficulties  Cognition Arousal/Alertness: Awake/alert Behavior During Therapy: WFL for tasks assessed/performed Overall Cognitive Status: Within Functional Limits for tasks assessed                   General Comments      Exercises Total Joint Exercises Ankle Circles/Pumps: AROM;15 reps;Seated;Both Quad Sets: AROM;5 reps;Supine;Seated;Left(2 sets (1x5 supine, 1x5 seated)) Long Arc Quad: AROM;5 reps;Seated;Left   Assessment/Plan    PT Assessment Patient needs continued PT services  PT Problem List Decreased strength;Decreased balance;Decreased range of motion;Decreased activity tolerance;Decreased mobility;Decreased knowledge of use of DME;Pain       PT Treatment Interventions DME instruction;Functional mobility training;Balance training;Patient/family education;Therapeutic activities;Gait training;Manual techniques;Therapeutic exercise;Stair training;Modalities    PT Goals (Current goals can be found in the Care Plan section)  Acute Rehab PT Goals Patient Stated Goal: to return home,rehab, and then get back to work PT Goal Formulation: With patient Time For Goal Achievement: 03/27/19 Potential to Achieve Goals: Good    Frequency 7X/week    AM-PAC PT "6 Clicks" Mobility  Outcome Measure Help needed turning from your back to your side while in a flat bed without using bedrails?: A Little Help needed moving from lying on your back to sitting on the side of a flat bed without using bedrails?: A Little Help needed moving to and  from a bed to a chair (including a wheelchair)?: A Little Help needed standing up from a chair using your arms (e.g., wheelchair or bedside chair)?: A Little Help needed to walk in hospital room?: A Little Help needed climbing 3-5 steps with a railing? : A Lot 6 Click Score: 17    End of Session Equipment Utilized During Treatment: Gait belt Activity Tolerance: Patient tolerated treatment well Patient left: in chair;with call bell/phone within reach;with chair alarm set;with family/visitor present Nurse Communication: Mobility status PT Visit Diagnosis: Unsteadiness on feet (R26.81);Difficulty in walking, not elsewhere classified (R26.2);Other abnormalities of gait and mobility (R26.89);Muscle weakness (generalized) (M62.81);Pain Pain - Right/Left: Left Pain - part of body: Knee    Time: [id number] PT Time Calculation (min) (ACUTE ONLY): 22 min   Charges:   PT Evaluation $PT Eval Low Complexity: 1 Low          Kipp Brood, PT, DPT, [facility] Physical Therapist with [facility]  03/20/2019 2:57 PM

## 2019-03-20 NOTE — Care Plan (Signed)
Ortho Bundle Case Management Note  Patient Details  Name: Jill Braun MRN: [id number] Date of Birth: [birth year]   Spoke with patient prior to surgery. She plans to discharge to home with family and HHPT. Requesting Verl Dicker, PT from [facility]. Referral made. Rolling walker and CPM ordered. Patient and MD in agreement with plan. Choice offered.                 DME Arranged:  CPM, Walker rolling DME Agency:     HH Arranged:  PT HH Agency:  [facility]  Additional Comments: Please contact me with any questions of if this plan should need to change.  Ladell Heads,  [facility] Specialist  [phone] 03/20/2019, 9:06 AM

## 2019-03-20 NOTE — Anesthesia Procedure Notes (Signed)
Anesthesia Regional Block: Adductor canal block   Pre-Anesthetic Checklist: ,, timeout performed, Correct Patient, Correct Site, Correct Laterality, Correct Procedure, Correct Position, site marked, Risks and benefits discussed,  Surgical consent,  Pre-op evaluation,  At surgeon's request and post-op pain management  Laterality: Left  Prep: chloraprep       Needles:  Injection technique: Single-shot  Needle Type: Stimiplex     Needle Length: 9cm  Needle Gauge: 21     Additional Needles:   Procedures:,,,, ultrasound used (permanent image in chart),,,,  Narrative:  Start time: 03/20/2019 6:55 AM End time: 03/20/2019 7:02 AM Injection made incrementally with aspirations every 5 mL.  Performed by: Personally  Anesthesiologist: Nolon Nations, MD  Additional Notes: BP cuff, EKG monitors applied. Sedation begun. Artery and nerve location verified with U/S and anesthetic injected incrementally, slowly, and after negative aspirations under direct u/s guidance. Good fascial /perineural spread. Tolerated well.

## 2019-03-20 NOTE — Transfer of Care (Signed)
Immediate Anesthesia Transfer of Care Note  Patient: Jill Braun  Procedure(s) Performed: TOTAL KNEE ARTHROPLASTY (Left Knee)  Patient Location: PACU  Anesthesia Type:MAC and Spinal  Level of Consciousness: drowsy, patient cooperative and responds to stimulation  Airway & Oxygen Therapy: Patient Spontanous Breathing and Patient connected to face mask oxygen  Post-op Assessment: Report given to RN and Post -op Vital signs reviewed and stable  Post vital signs: Reviewed and stable  Last Vitals:  Vitals Value Taken Time  BP 109/61 03/20/19 0920  Temp    Pulse 88 03/20/19 0922  Resp 24 03/20/19 0922  SpO2 95 % 03/20/19 0922  Vitals shown include unvalidated device data.  Last Pain:  Vitals:   03/20/19 0616  TempSrc: Oral  PainSc:          Complications: No apparent anesthesia complications

## 2019-03-21 ENCOUNTER — Encounter (HOSPITAL_COMMUNITY): Payer: Self-pay | Admitting: Orthopedic Surgery

## 2019-03-21 DIAGNOSIS — I671 Cerebral aneurysm, nonruptured: Secondary | ICD-10-CM | POA: Diagnosis not present

## 2019-03-21 DIAGNOSIS — K219 Gastro-esophageal reflux disease without esophagitis: Secondary | ICD-10-CM | POA: Diagnosis not present

## 2019-03-21 DIAGNOSIS — J441 Chronic obstructive pulmonary disease with (acute) exacerbation: Secondary | ICD-10-CM | POA: Diagnosis not present

## 2019-03-21 DIAGNOSIS — I10 Essential (primary) hypertension: Secondary | ICD-10-CM | POA: Diagnosis not present

## 2019-03-21 DIAGNOSIS — K580 Irritable bowel syndrome with diarrhea: Secondary | ICD-10-CM | POA: Diagnosis not present

## 2019-03-21 DIAGNOSIS — M1712 Unilateral primary osteoarthritis, left knee: Secondary | ICD-10-CM | POA: Diagnosis not present

## 2019-03-21 LAB — CBC
HCT: 40.3 % (ref 36.0–46.0)
Hemoglobin: 13.1 g/dL (ref 12.0–15.0)
MCH: 30.5 pg (ref 26.0–34.0)
MCHC: 32.5 g/dL (ref 30.0–36.0)
MCV: 93.9 fL (ref 80.0–100.0)
Platelets: 183 10*3/uL (ref 150–400)
RBC: 4.29 MIL/uL (ref 3.87–5.11)
RDW: 12.7 % (ref 11.5–15.5)
WBC: 14.1 10*3/uL — ABNORMAL HIGH (ref 4.0–10.5)
nRBC: 0 % (ref 0.0–0.2)

## 2019-03-21 LAB — BASIC METABOLIC PANEL
Anion gap: 8 (ref 5–15)
BUN: 11 mg/dL (ref 8–23)
CO2: 23 mmol/L (ref 22–32)
Calcium: 8.8 mg/dL — ABNORMAL LOW (ref 8.9–10.3)
Chloride: 107 mmol/L (ref 98–111)
Creatinine, Ser: 0.75 mg/dL (ref 0.44–1.00)
GFR calc Af Amer: >60 mL/min (ref >60)
GFR calc non Af Amer: >60 mL/min (ref >60)
Glucose, Bld: 149 mg/dL — ABNORMAL HIGH (ref 70–99)
Potassium: 4.4 mmol/L (ref 3.5–5.1)
Sodium: 138 mmol/L (ref 135–145)

## 2019-03-21 LAB — URINE CULTURE: Culture: NO GROWTH

## 2019-03-21 MED ORDER — DOCUSATE SODIUM 100 MG PO CAPS: ORAL_CAPSULE | ORAL | 0 refills | Status: DC

## 2019-03-21 MED ORDER — POLYETHYLENE GLYCOL 3350 17 G PO PACK: PACK | ORAL | 0 refills | Status: DC

## 2019-03-21 MED ORDER — GABAPENTIN 300 MG PO CAPS: ORAL_CAPSULE | ORAL | 0 refills | Status: DC

## 2019-03-21 MED ORDER — OXYCODONE HCL 5 MG PO TABS: ORAL_TABLET | ORAL | 0 refills | Status: DC

## 2019-03-21 MED ORDER — SODIUM CHLORIDE 0.9 % IV BOLUS
500.0000 mL | Freq: Once | INTRAVENOUS | Status: AC
  Administered 2019-03-21: 500 mL via INTRAVENOUS

## 2019-03-21 MED ORDER — ASPIRIN 325 MG PO TBEC: DELAYED_RELEASE_TABLET | ORAL | 0 refills | Status: DC

## 2019-03-21 NOTE — TOC Transition Note (Signed)
Transition of Care [facility]) - CM/SW Discharge Note   Patient Details  Name: Jill Braun MRN: [id number] Date of Birth: 28-Sep-1945  Transition of [facility]) CM/SW Contact:  Leeroy Cha, RN Phone Number: 03/21/2019, 11:00 AM   Clinical Narrative:    dcd to home with hhc and equip.   Final next level of care: Home w Home Health Services Barriers to Discharge: No Barriers Identified   Patient Goals and CMS Choice        Discharge Placement                       Discharge Plan and Services   Discharge Planning Services: CM Consult            DME Arranged: CPM, Walker rolling         HH Arranged: PT HH Agency: [facility] [employer] Date [place]: 03/20/19 Time [place]: 56 Representative spoke with at [place]: [place] ([place]) Interventions     Readmission Risk Interventions No flowsheet data found.

## 2019-03-21 NOTE — Care Management Obs Status (Signed)
[place] NOTIFICATION   Patient Details  Name: Jill Braun MRN: [id number] Date of Birth: [birth year]   Medicare Observation Status Notification Given:  Yes    Leeroy Cha, RN 03/21/2019, 11:07 AM

## 2019-03-21 NOTE — Progress Notes (Signed)
Physical Therapy Treatment Patient Details Name: Jill Braun MRN: [id number] DOB: [birth year] Today's Date: 03/21/2019    History of Present Illness Patient is 73 y.o. female s/p Lt TKA on 03/20/19 with PMH significant for OA, HLD, GERD, COPD, and history of cerebral aneurysm in 2003 and cardiac cath in 2009.    PT Comments    Progressing with mobility. Pt had some difficulty with stair negotiation this a.m so will plan to have a 2nd session prior to d/c later today.    Follow Up Recommendations  Follow surgeon's recommendation for DC plan and follow-up therapies     Equipment Recommendations  None recommended by PT    Recommendations for Other Services       Precautions / Restrictions Precautions Precautions: Fall Restrictions Weight Bearing Restrictions: No Other Position/Activity Restrictions: WBAT    Mobility  Bed Mobility               General bed mobility comments: oob in recliner  Transfers Overall transfer level: Needs assistance Equipment used: Rolling walker (2 wheeled) Transfers: Sit to/from Stand Sit to Stand: Min assist         General transfer comment: VCs safety, technique, hand placement. Assist to steady.  Ambulation/Gait Ambulation/Gait assistance: Min assist Gait Distance (Feet): 60 Feet Assistive device: Rolling walker (2 wheeled) Gait Pattern/deviations: Step-through pattern;Antalgic;Decreased step length - left;Decreased stride length;Decreased stance time - left;Decreased weight shift to left     General Gait Details: pt reliant on UE support to unweight L LE secondary to pain. Cues for safety, technique, hand placement   Stairs             Wheelchair Mobility    Modified Rankin (Stroke Patients Only)       Balance Overall balance assessment: Needs assistance         Standing balance support: Bilateral upper extremity supported Standing balance-Leahy Scale: Poor                               Cognition Arousal/Alertness: Awake/alert Behavior During Therapy: WFL for tasks assessed/performed Overall Cognitive Status: Within Functional Limits for tasks assessed                                        Exercises Total Joint Exercises Ankle Circles/Pumps: AROM;Seated;Both;10 reps Quad Sets: AROM;Seated;Left;10 reps Hip ABduction/ADduction: AROM;AAROM;Left;10 reps;Seated Straight Leg Raises: AROM;AAROM;Left;10 reps;Supine Knee Flexion: AAROM;Left;10 reps;Seated Goniometric ROM: ~10-65 degrees    General Comments        Pertinent Vitals/Pain Pain Assessment: 0-10 Pain Score: 7  Pain Location: L knee Pain Descriptors / Indicators: Aching;Sore Pain Intervention(s): Monitored during session;Ice applied;Repositioned    Home Living                      Prior Function            PT Goals (current goals can now be found in the care plan section) Progress towards PT goals: Progressing toward goals    Frequency    7X/week      PT Plan Current plan remains appropriate    Co-evaluation              AM-PAC PT "6 Clicks" Mobility   Outcome Measure  Help needed turning from your back to your side while in a flat bed without using  bedrails?: A Little Help needed moving from lying on your back to sitting on the side of a flat bed without using bedrails?: A Little Help needed moving to and from a bed to a chair (including a wheelchair)?: A Little Help needed standing up from a chair using your arms (e.g., wheelchair or bedside chair)?: A Little Help needed to walk in hospital room?: A Little Help needed climbing 3-5 steps with a railing? : A Little 6 Click Score: 18    End of Session Equipment Utilized During Treatment: Gait belt Activity Tolerance: Patient tolerated treatment well Patient left: in chair;with call bell/phone within reach   PT Visit Diagnosis: Unsteadiness on feet (R26.81);Difficulty in walking, not elsewhere classified  (R26.2);Other abnormalities of gait and mobility (R26.89);Muscle weakness (generalized) (M62.81);Pain Pain - Right/Left: Left Pain - part of body: Knee     Time: [id number] PT Time Calculation (min) (ACUTE ONLY): 22 min  Charges:  $Gait Training: 8-22 mins                        Weston Anna, [place] Pager: [phone] Office: [phone]

## 2019-03-21 NOTE — Discharge Summary (Signed)
Patient ID: Jill Braun MRN: [id number] DOB/AGE: 1945/07/25 73 y.o.  Admit date: 03/20/2019 Discharge date: 03/21/2019  Admission Diagnoses:  Principal Problem:   Primary localized osteoarthritis of left knee Active Problems:   GERD (gastroesophageal reflux disease)   COPD exacerbation (HCC)   Irritable bowel syndrome with diarrhea   Middle cerebral artery aneurysm   Essential hypertension   Discharge Diagnoses:  Same  Past Medical History:  Diagnosis Date  . COPD (chronic obstructive pulmonary disease) ([place]) 06/02/2016  . Food poisoning 2019   AT [facility]  . GERD (gastroesophageal reflux disease)   . Headache   . History of cardiac catheterization    Normal coronary arteries 2009  . Hyperlipidemia   . Middle cerebral artery aneurysm    Left - [facility] 2003  . Primary localized osteoarthritis of left knee 03/08/2019    Surgeries: Procedure(s): TOTAL KNEE ARTHROPLASTY on 03/20/2019   Consultants:   Discharged Condition: Improved  Hospital Course: Jill Braun is an 73 y.o. female who was admitted 03/20/2019 for operative treatment ofPrimary localized osteoarthritis of left knee. Patient has severe unremitting pain that affects sleep, daily activities, and work/hobbies. After pre-op clearance the patient was taken to the operating room on 03/20/2019 and underwent  Procedure(s): TOTAL KNEE ARTHROPLASTY.    Patient was given perioperative antibiotics:  Anti-infectives (From admission, onward)   Start     Dose/Rate Route Frequency Ordered Stop   03/20/19 1330  ceFAZolin (ANCEF) IVPB 2g/100 mL premix     2 g 200 mL/hr over 30 Minutes Intravenous Every 6 hours 03/20/19 1050 03/20/19 2144   03/20/19 0803  cefUROXime (ZINACEF) 1.5 g in sodium chloride 0.9 % 100 mL IVPB     over 30 Minutes  Continuous PRN 03/20/19 0804 03/20/19 0803   03/20/19 0600  ceFAZolin (ANCEF) IVPB 2g/100 mL premix     2 g 200 mL/hr over 30 Minutes Intravenous On call to O.R. 03/20/19 [id number]  03/20/19 0755   03/20/19 0550  ceFAZolin (ANCEF) 2-4 GM/100ML-% IVPB    Note to Pharmacy: Lavon Paganini   : cabinet override      03/20/19 0550 03/20/19 0725       Patient was given sequential compression devices, early ambulation, and chemoprophylaxis to prevent DVT.  Patient benefited maximally from hospital stay and there were no complications.    Recent vital signs:  Patient Vitals for the past 24 hrs:  BP Temp Temp src Pulse Resp SpO2  03/21/19 0442 111/62 97.7 F (36.5 C) - 84 18 95 %  03/21/19 0114 121/83 97.8 F (36.6 C) - 68 18 97 %  03/20/19 2210 (!) 112/57 - - - - -  03/20/19 2046 101/65 97.8 F (36.6 C) - 62 18 97 %  03/20/19 1844 110/66 - - 67 16 96 %  03/20/19 1339 113/66 (!) 97.5 F (36.4 C) Oral 79 16 96 %  03/20/19 1238 122/68 (!) 97.4 F (36.3 C) Oral 88 16 97 %  03/20/19 1137 (!) 114/59 (!) 97.5 F (36.4 C) Oral 73 16 97 %  03/20/19 1040 111/75 97.6 F (36.4 C) Oral 76 18 99 %  03/20/19 1015 120/64 97.6 F (36.4 C) - 92 18 98 %  03/20/19 1000 108/66 - - 63 19 98 %  03/20/19 0945 98/71 - - 71 17 97 %  03/20/19 0930 (!) 106/59 - - 82 18 98 %     Recent laboratory studies:  Recent Labs    03/21/19 0237  WBC 14.1*  HGB  13.1  HCT 40.3  PLT 183  NA 138  K 4.4  CL 107  CO2 23  BUN 11  CREATININE 0.75  GLUCOSE 149*  CALCIUM 8.8*     Discharge Medications:   Allergies as of 03/21/2019   No Known Allergies     Medication List    STOP taking these medications   ibuprofen 200 MG tablet Commonly known as: ADVIL     TAKE these medications   acetaminophen 500 MG tablet Commonly known as: TYLENOL Take 500 mg by mouth every 6 (six) hours as needed for moderate pain or headache.   Albuterol Sulfate 108 (90 Base) MCG/ACT Aepb Inhale 1 puff into the lungs every 6 (six) hours as needed (for shortness of breath/wheezing).   aspirin 325 MG EC tablet 1 tab a day for the next 30 days to prevent blood clots.  Patient to call if she decides to smoke  and will change blood thinners   docusate sodium 100 MG capsule Commonly known as: COLACE 1 tab 2 times a day while on narcotics.  STOOL SOFTENER   esomeprazole 40 MG capsule Commonly known as: NEXIUM Take 40 mg by mouth daily.   gabapentin 300 MG capsule Commonly known as: NEURONTIN 1 tablet at night for nerve pain   multivitamin tablet Take 1 tablet by mouth daily.   oxyCODONE 5 MG immediate release tablet Commonly known as: Oxy IR/ROXICODONE 1 po q 4 hrs prn pain   polyethylene glycol 17 g packet Commonly known as: MIRALAX / GLYCOLAX 17grams in 6 oz of something to drink twice a day until bowel movement.  LAXITIVE.  Restart if two days since last bowel movement   potassium chloride 10 MEQ tablet Commonly known as: K-DUR Take 10 mEq by mouth daily as needed (cramps).            Discharge Care Instructions  (From admission, onward)         Start     Ordered   03/21/19 0000  Change dressing    Comments: DO NOT REMOVE BANDAGE OVER SURGICAL INCISION.  [place] WHOLE LEG INCLUDING OVER THE WATERPROOF BANDAGE WITH SOAP AND WATER EVERY DAY.   03/21/19 0927          Diagnostic Studies: Dg Chest 2 View  Result Date: 03/14/2019 CLINICAL DATA:  Preoperative evaluation for knee replacement EXAM: CHEST - 2 VIEW COMPARISON:  Radiograph 07/23/2017, CT 07/21/2017 FINDINGS: Coarse interstitial changes are similar to prior exams. Streaky basilar atelectasis is present. No consolidation, features of edema, pneumothorax, or effusion. There is tortuosity of right brachiocephalic vessels which corresponds well to the CT. The aorta is calcified. The remaining cardiomediastinal contours are unremarkable. No acute osseous or soft tissue abnormality. Degenerative changes are present in the imaged spine and shoulders. IMPRESSION: Coarse interstitial changes are similar to prior exams. No acute cardiopulmonary disease. Electronically Signed   By: Lovena Le M.D.   On: 03/14/2019 02:45     Disposition: Discharge disposition: 01-Home or Self Care       Discharge Instructions    CPM   Complete by: As directed    Continuous passive motion machine (CPM):      Use the CPM from 0 to 90 for 6 hours per day.       You may break it up into 2 or 3 sessions per day.      Use CPM for 2 weeks or until you are told to stop.   Call MD / Call 911  Complete by: As directed    If you experience chest pain or shortness of breath, CALL 911 and be transported to the hospital emergency room.  If you develope a fever above 101 F, pus (white drainage) or increased drainage or redness at the wound, or calf pain, call your surgeon's office.   Change dressing   Complete by: As directed    DO NOT REMOVE BANDAGE OVER SURGICAL INCISION.  [place] WHOLE LEG INCLUDING OVER THE WATERPROOF BANDAGE WITH SOAP AND WATER EVERY DAY.   Constipation Prevention   Complete by: As directed    Drink plenty of fluids.  Prune juice may be helpful.  You may use a stool softener, such as Colace (over the counter) 100 mg twice a day.  Use MiraLax (over the counter) for constipation as needed.   Diet - low sodium heart healthy   Complete by: As directed    Discharge instructions   Complete by: As directed    INSTRUCTIONS AFTER JOINT REPLACEMENT   Get up and walk at least every hour.  Drink lots of fluid.  Use incentive spirometer instead of cigarettes.  If you start smoking call Dr Archie Endo office because he will need to switch your aspirin to a stronger blood thinner to prevent blood clots.  Smoking significantly increases your risk of getting a blood clot.  Especially the first 35 days after surgery!  Remove items at home which could result in a fall. This includes throw rugs or furniture in walking pathways ICE to the affected joint every three hours while awake for 30 minutes at a time, for at least the first 3-5 days, and then as needed for pain and swelling.  Continue to use ice for pain and swelling. You may  notice swelling that will progress down to the foot and ankle.  This is normal after surgery.  Elevate your leg when you are not up walking on it.   Continue to use the breathing machine you got in the hospital (incentive spirometer) which will help keep your temperature down.  It is common for your temperature to cycle up and down following surgery, especially at night when you are not up moving around and exerting yourself.  The breathing machine keeps your lungs expanded and your temperature down.   DIET:  As you were doing prior to hospitalization, we recommend a well-balanced diet.  DRESSING / WOUND CARE / SHOWERING  Keep the surgical dressing until follow up.  The dressing is water proof, so you can shower without any extra covering.  IF THE DRESSING FALLS OFF or the wound gets wet inside, change the dressing with sterile gauze.  Please use good hand washing techniques before changing the dressing.  Do not use any lotions or creams on the incision until instructed by your surgeon.    ACTIVITY  Increase activity slowly as tolerated, but follow the weight bearing instructions below.   No driving for 6 weeks or until further direction given by your physician.  You cannot drive while taking narcotics.  No lifting or carrying greater than 10 lbs. until further directed by your surgeon. Avoid periods of inactivity such as sitting longer than an hour when not asleep. This helps prevent blood clots.  You may return to work once you are authorized by your doctor.     WEIGHT BEARING   Weight bearing as tolerated with assist device (walker, cane, etc) as directed, use it as long as suggested by your surgeon or therapist, typically at  least 2-3 weeks.   EXERCISES  Results after joint replacement surgery are often greatly improved when you follow the exercise, range of motion and muscle strengthening exercises prescribed by your doctor. Safety measures are also important to protect the joint from  further injury. Any time any of these exercises cause you to have increased pain or swelling, decrease what you are doing until you are comfortable again and then slowly increase them. If you have problems or questions, call your caregiver or physical therapist for advice.   Rehabilitation is important following a joint replacement. After just a few days of immobilization, the muscles of the leg can become weakened and shrink (atrophy).  These exercises are designed to build up the tone and strength of the thigh and leg muscles and to improve motion. Often times heat used for twenty to thirty minutes before working out will loosen up your tissues and help with improving the range of motion but do not use heat for the first two weeks following surgery (sometimes heat can increase post-operative swelling).   These exercises can be done on a training (exercise) mat, on the floor, on a table or on a bed. Use whatever works the best and is most comfortable for you.    Use music or television while you are exercising so that the exercises are a pleasant break in your day. This will make your life better with the exercises acting as a break in your routine that you can look forward to.   Perform all exercises about fifteen times, three times per day or as directed.  You should exercise both the operative leg and the other leg as well.   Exercises include:  Quad Sets - Tighten up the muscle on the front of the thigh (Quad) and hold for 5-10 seconds.   Straight Leg Raises - With your knee straight (if you were given a brace, keep it on), lift the leg to 60 degrees, hold for 3 seconds, and slowly lower the leg.  Perform this exercise against resistance later as your leg gets stronger.  Leg Slides: Lying on your back, slowly slide your foot toward your buttocks, bending your knee up off the floor (only go as far as is comfortable). Then slowly slide your foot back down until your leg is flat on the floor again.   Angel Wings: Lying on your back spread your legs to the side as far apart as you can without causing discomfort.  Hamstring Strength:  Lying on your back, push your heel against the floor with your leg straight by tightening up the muscles of your buttocks.  Repeat, but this time bend your knee to a comfortable angle, and push your heel against the floor.  You may put a pillow under the heel to make it more comfortable if necessary.   A rehabilitation program following joint replacement surgery can speed recovery and prevent re-injury in the future due to weakened muscles. Contact your doctor or a physical therapist for more information on knee rehabilitation.    CONSTIPATION  Constipation is defined medically as fewer than three stools per week and severe constipation as less than one stool per week.  Even if you have a regular bowel pattern at home, your normal regimen is likely to be disrupted due to multiple reasons following surgery.  Combination of anesthesia, postoperative narcotics, change in appetite and fluid intake all can affect your bowels.   YOU MUST use at least one of the following options; they  are listed in order of increasing strength to get the job done.  They are all available over the counter, and you may need to use some, POSSIBLY even all of these options:    Drink plenty of fluids (prune juice may be helpful) and high fiber foods Colace 100 mg by mouth twice a day  Senokot for constipation as directed and as needed Dulcolax (bisacodyl), take with full glass of water  Miralax (polyethylene glycol) once or twice a day as needed.  If you have tried all these things and are unable to have a bowel movement in the first 3-4 days after surgery call either your surgeon or your primary doctor.    If you experience loose stools or diarrhea, hold the medications until you stool forms back up.  If your symptoms do not get better within 1 week or if they get worse, check with your  doctor.  If you experience "the worst abdominal pain ever" or develop nausea or vomiting, please contact the office immediately for further recommendations for treatment.   ITCHING:  If you experience itching with your medications, try taking only a single pain pill, or even half a pain pill at a time.  You can also use Benadryl over the counter for itching or also to help with sleep.   TED HOSE STOCKINGS:  Use stockings on both legs until for at least 2 weeks or as directed by physician office. They may be removed at night for sleeping.  MEDICATIONS:  See your medication summary on the "After Visit Summary" that nursing will review with you.  You may have some home medications which will be placed on hold until you complete the course of blood thinner medication.  It is important for you to complete the blood thinner medication as prescribed.  PRECAUTIONS:  If you experience chest pain or shortness of breath - call 911 immediately for transfer to the hospital emergency department.   If you develop a fever greater that 101 F, purulent drainage from wound, increased redness or drainage from wound, foul odor from the wound/dressing, or calf pain - CONTACT YOUR SURGEON.                                                   FOLLOW-UP APPOINTMENTS:  If you do not already have a post-op appointment, please call the office for an appointment to be seen by your surgeon.  Guidelines for how soon to be seen are listed in your "After Visit Summary", but are typically between 1-4 weeks after surgery.  OTHER INSTRUCTIONS:   Knee Replacement:  Do not place pillow under knee, focus on keeping the knee straight while resting. CPM instructions: 0-90 degrees, 2 hours in the morning, 2 hours in the afternoon, and 2 hours in the evening. Place foam block, curve side up under heel at all times except when in CPM or when walking.  DO NOT modify, tear, cut, or change the foam block in any way.  MAKE SURE YOU:  Understand  these instructions.  Get help right away if you are not doing well or get worse.    Thank you for letting us be a part of your medical care team.  It is a privilege we respect greatly.  We hope these instructions will help you stay on track for a fast and full  recovery!   Do not put a pillow under the knee. Place it under the heel.   Complete by: As directed    Place gray foam block, curve side up under heel at all times except when in CPM or when walking.  DO NOT modify, tear, cut, or change in any way the gray foam block.   Increase activity slowly as tolerated   Complete by: As directed    Patient may shower   Complete by: As directed    Aquacel dressing is water proof    Wash over it and the whole leg with soap and water at the end of your shower   TED hose   Complete by: As directed    Use stockings (TED hose) for 2 weeks on both leg(s).  You may remove them at night for sleeping.      Follow-up Information    Elsie Saas, MD. Go on 04/04/2019.   Specialty: Orthopedic Surgery Why: Your appointment has been made for 3:00  Contact information: [address] [place] [address] [phone]        [facility] Follow up.   Why: You will be seen at home by HHPT for 5 visits prior to follow up with Dr Leotis Pain information: [address] [place] [address] [phone]       Practice, [facility]. Go on 04/04/2019.   Specialty: Physical Therapy Why: You are scheduled to start Outpatient physical therapy at 2:00. Please arrive a few minutes early to complete your paperwork  Contact information: [place] [address] [phone]            Signed: Linda Hedges 03/21/2019, 9:28 AM

## 2019-03-21 NOTE — Progress Notes (Signed)
Physical Therapy Treatment Patient Details Name: Jill Braun MRN: [id number] DOB: [birth year] Today's Date: 03/21/2019    History of Present Illness Patient is 73 y.o. female s/p Lt TKA on 03/20/19 with PMH significant for OA, HLD, GERD, COPD, and history of cerebral aneurysm in 2003 and cardiac cath in 2009.    PT Comments    2nd session to practice stair negotiation. Family member/caregiver was present to observe/practice. All education completed. Pt is eager to d/c home. Okay to d/c home from PT standpoint.    Follow Up Recommendations  Follow surgeon's recommendation for DC plan and follow-up therapies     Equipment Recommendations  None recommended by PT    Recommendations for Other Services       Precautions / Restrictions Precautions Precautions: Fall Restrictions Weight Bearing Restrictions: No Other Position/Activity Restrictions: WBAT    Mobility  Bed Mobility               General bed mobility comments: oob in recliner  Transfers Overall transfer level: Needs assistance Equipment used: Rolling walker (2 wheeled) Transfers: Sit to/from Stand Sit to Stand: Min assist         General transfer comment: VCs safety, technique, hand placement. Assist to steady.  Ambulation/Gait Ambulation/Gait assistance: Min assist Gait Distance (Feet): 40 Feet Assistive device: Rolling walker (2 wheeled) Gait Pattern/deviations: Step-to pattern     General Gait Details: pt reliant on UE support to unweight L LE secondary to pain. Cues for safety, technique, hand placement   Stairs Stairs: Yes Stairs assistance: Min assist Stair Management: Backwards;With walker Number of Stairs: 2 General stair comments: VCs safety, technique, sequence. Practiced backwards technique since pt had difficulty going up forwards. Daughter was present to observe and assist.   Wheelchair Mobility    Modified Rankin (Stroke Patients Only)       Balance Overall balance  assessment: Needs assistance         Standing balance support: Bilateral upper extremity supported Standing balance-Leahy Scale: Poor                              Cognition Arousal/Alertness: Awake/alert Behavior During Therapy: WFL for tasks assessed/performed Overall Cognitive Status: Within Functional Limits for tasks assessed                                        Exercises Total Joint Exercises Ankle Circles/Pumps: AROM;Seated;Both;10 reps Quad Sets: AROM;Seated;Left;10 reps Hip ABduction/ADduction: AROM;AAROM;Left;10 reps;Seated Straight Leg Raises: AROM;AAROM;Left;10 reps;Supine Knee Flexion: AAROM;Left;10 reps;Seated Goniometric ROM: ~10-65 degrees    General Comments        Pertinent Vitals/Pain Pain Assessment: 0-10 Pain Score: 7  Pain Location: L knee Pain Descriptors / Indicators: Aching;Sore Pain Intervention(s): Monitored during session;Repositioned    Home Living                      Prior Function            PT Goals (current goals can now be found in the care plan section) Progress towards PT goals: Progressing toward goals    Frequency    7X/week      PT Plan Current plan remains appropriate    Co-evaluation              AM-PAC PT "6 Clicks" Mobility   Outcome Measure  Help needed turning from your back to your side while in a flat bed without using bedrails?: A Little Help needed moving from lying on your back to sitting on the side of a flat bed without using bedrails?: A Little Help needed moving to and from a bed to a chair (including a wheelchair)?: A Little Help needed standing up from a chair using your arms (e.g., wheelchair or bedside chair)?: A Little Help needed to walk in hospital room?: A Little Help needed climbing 3-5 steps with a railing? : A Little 6 Click Score: 18    End of Session Equipment Utilized During Treatment: Gait belt Activity Tolerance: Patient tolerated  treatment well Patient left: in chair;with call bell/phone within reach;with family/visitor present   PT Visit Diagnosis: Unsteadiness on feet (R26.81);Difficulty in walking, not elsewhere classified (R26.2);Other abnormalities of gait and mobility (R26.89);Pain Pain - Right/Left: Left Pain - part of body: Knee     Time: 1150-1201 PT Time Calculation (min) (ACUTE ONLY): 11 min  Charges:  $Gait Training: 8-22 mins                        Weston Anna, PT Acute Rehabilitation Services Pager: [phone] Office: [phone]

## 2019-03-22 DIAGNOSIS — Z471 Aftercare following joint replacement surgery: Secondary | ICD-10-CM | POA: Diagnosis not present

## 2019-03-22 DIAGNOSIS — K580 Irritable bowel syndrome with diarrhea: Secondary | ICD-10-CM | POA: Diagnosis not present

## 2019-03-22 DIAGNOSIS — J441 Chronic obstructive pulmonary disease with (acute) exacerbation: Secondary | ICD-10-CM | POA: Diagnosis not present

## 2019-03-22 DIAGNOSIS — I10 Essential (primary) hypertension: Secondary | ICD-10-CM | POA: Diagnosis not present

## 2019-03-22 DIAGNOSIS — Z96652 Presence of left artificial knee joint: Secondary | ICD-10-CM | POA: Diagnosis not present

## 2019-03-22 DIAGNOSIS — E785 Hyperlipidemia, unspecified: Secondary | ICD-10-CM | POA: Diagnosis not present

## 2019-03-22 DIAGNOSIS — F17210 Nicotine dependence, cigarettes, uncomplicated: Secondary | ICD-10-CM | POA: Diagnosis not present

## 2019-03-22 DIAGNOSIS — I671 Cerebral aneurysm, nonruptured: Secondary | ICD-10-CM | POA: Diagnosis not present

## 2019-03-22 DIAGNOSIS — K219 Gastro-esophageal reflux disease without esophagitis: Secondary | ICD-10-CM | POA: Diagnosis not present

## 2019-03-24 DIAGNOSIS — I671 Cerebral aneurysm, nonruptured: Secondary | ICD-10-CM | POA: Diagnosis not present

## 2019-03-24 DIAGNOSIS — J441 Chronic obstructive pulmonary disease with (acute) exacerbation: Secondary | ICD-10-CM | POA: Diagnosis not present

## 2019-03-24 DIAGNOSIS — Z471 Aftercare following joint replacement surgery: Secondary | ICD-10-CM | POA: Diagnosis not present

## 2019-03-24 DIAGNOSIS — I10 Essential (primary) hypertension: Secondary | ICD-10-CM | POA: Diagnosis not present

## 2019-03-24 DIAGNOSIS — F17210 Nicotine dependence, cigarettes, uncomplicated: Secondary | ICD-10-CM | POA: Diagnosis not present

## 2019-03-24 DIAGNOSIS — Z96652 Presence of left artificial knee joint: Secondary | ICD-10-CM | POA: Diagnosis not present

## 2019-03-27 DIAGNOSIS — F17210 Nicotine dependence, cigarettes, uncomplicated: Secondary | ICD-10-CM | POA: Diagnosis not present

## 2019-03-27 DIAGNOSIS — I10 Essential (primary) hypertension: Secondary | ICD-10-CM | POA: Diagnosis not present

## 2019-03-27 DIAGNOSIS — Z96652 Presence of left artificial knee joint: Secondary | ICD-10-CM | POA: Diagnosis not present

## 2019-03-27 DIAGNOSIS — J441 Chronic obstructive pulmonary disease with (acute) exacerbation: Secondary | ICD-10-CM | POA: Diagnosis not present

## 2019-03-27 DIAGNOSIS — I671 Cerebral aneurysm, nonruptured: Secondary | ICD-10-CM | POA: Diagnosis not present

## 2019-03-27 DIAGNOSIS — Z471 Aftercare following joint replacement surgery: Secondary | ICD-10-CM | POA: Diagnosis not present

## 2019-03-29 DIAGNOSIS — F17210 Nicotine dependence, cigarettes, uncomplicated: Secondary | ICD-10-CM | POA: Diagnosis not present

## 2019-03-29 DIAGNOSIS — Z96652 Presence of left artificial knee joint: Secondary | ICD-10-CM | POA: Diagnosis not present

## 2019-03-29 DIAGNOSIS — J441 Chronic obstructive pulmonary disease with (acute) exacerbation: Secondary | ICD-10-CM | POA: Diagnosis not present

## 2019-03-29 DIAGNOSIS — I10 Essential (primary) hypertension: Secondary | ICD-10-CM | POA: Diagnosis not present

## 2019-03-29 DIAGNOSIS — I671 Cerebral aneurysm, nonruptured: Secondary | ICD-10-CM | POA: Diagnosis not present

## 2019-03-29 DIAGNOSIS — Z471 Aftercare following joint replacement surgery: Secondary | ICD-10-CM | POA: Diagnosis not present

## 2019-03-31 DIAGNOSIS — Z96652 Presence of left artificial knee joint: Secondary | ICD-10-CM | POA: Diagnosis not present

## 2019-03-31 DIAGNOSIS — Z471 Aftercare following joint replacement surgery: Secondary | ICD-10-CM | POA: Diagnosis not present

## 2019-03-31 DIAGNOSIS — J441 Chronic obstructive pulmonary disease with (acute) exacerbation: Secondary | ICD-10-CM | POA: Diagnosis not present

## 2019-03-31 DIAGNOSIS — I671 Cerebral aneurysm, nonruptured: Secondary | ICD-10-CM | POA: Diagnosis not present

## 2019-03-31 DIAGNOSIS — I10 Essential (primary) hypertension: Secondary | ICD-10-CM | POA: Diagnosis not present

## 2019-03-31 DIAGNOSIS — F17210 Nicotine dependence, cigarettes, uncomplicated: Secondary | ICD-10-CM | POA: Diagnosis not present

## 2019-04-04 DIAGNOSIS — M25562 Pain in left knee: Secondary | ICD-10-CM | POA: Diagnosis not present

## 2019-04-04 DIAGNOSIS — M1712 Unilateral primary osteoarthritis, left knee: Secondary | ICD-10-CM | POA: Diagnosis not present

## 2019-04-07 DIAGNOSIS — M25562 Pain in left knee: Secondary | ICD-10-CM | POA: Diagnosis not present

## 2019-04-11 DIAGNOSIS — M25562 Pain in left knee: Secondary | ICD-10-CM | POA: Diagnosis not present

## 2019-04-14 DIAGNOSIS — M25562 Pain in left knee: Secondary | ICD-10-CM | POA: Diagnosis not present

## 2019-04-17 DIAGNOSIS — M25562 Pain in left knee: Secondary | ICD-10-CM | POA: Diagnosis not present

## 2019-04-26 DIAGNOSIS — M25562 Pain in left knee: Secondary | ICD-10-CM | POA: Diagnosis not present

## 2019-05-01 DIAGNOSIS — M25562 Pain in left knee: Secondary | ICD-10-CM | POA: Diagnosis not present

## 2019-05-02 DIAGNOSIS — M1712 Unilateral primary osteoarthritis, left knee: Secondary | ICD-10-CM | POA: Diagnosis not present

## 2019-05-04 DIAGNOSIS — M25562 Pain in left knee: Secondary | ICD-10-CM | POA: Diagnosis not present

## 2019-05-04 DIAGNOSIS — M1712 Unilateral primary osteoarthritis, left knee: Secondary | ICD-10-CM | POA: Diagnosis not present

## 2019-05-11 DIAGNOSIS — M25562 Pain in left knee: Secondary | ICD-10-CM | POA: Diagnosis not present

## 2019-05-18 DIAGNOSIS — M25562 Pain in left knee: Secondary | ICD-10-CM | POA: Diagnosis not present

## 2019-05-22 DIAGNOSIS — M25562 Pain in left knee: Secondary | ICD-10-CM | POA: Diagnosis not present

## 2019-05-24 DIAGNOSIS — M25562 Pain in left knee: Secondary | ICD-10-CM | POA: Diagnosis not present

## 2019-05-30 ENCOUNTER — Other Ambulatory Visit: Payer: Self-pay

## 2019-05-30 ENCOUNTER — Ambulatory Visit (HOSPITAL_COMMUNITY)
Admission: RE | Admit: 2019-05-30 | Discharge: 2019-05-30 | Disposition: A | Discharge status: Home / Self Care | Payer: Medicare Other | Source: Ambulatory Visit | Attending: Orthopedic Surgery | Admitting: Orthopedic Surgery

## 2019-05-30 ENCOUNTER — Other Ambulatory Visit: Payer: Self-pay | Admitting: Orthopedic Surgery

## 2019-05-30 DIAGNOSIS — M79605 Pain in left leg: Secondary | ICD-10-CM

## 2019-05-30 DIAGNOSIS — R600 Localized edema: Secondary | ICD-10-CM | POA: Diagnosis not present

## 2019-05-30 DIAGNOSIS — M7989 Other specified soft tissue disorders: Secondary | ICD-10-CM | POA: Insufficient documentation

## 2019-05-31 ENCOUNTER — Ambulatory Visit: Payer: Self-pay

## 2019-05-31 ENCOUNTER — Ambulatory Visit (INDEPENDENT_AMBULATORY_CARE_PROVIDER_SITE_OTHER): Payer: Medicare Other | Admitting: Sports Medicine

## 2019-05-31 ENCOUNTER — Encounter: Payer: Self-pay | Admitting: Sports Medicine

## 2019-05-31 VITALS — BP 139/78 | Ht 61.5 in | Wt 178.0 lb

## 2019-05-31 DIAGNOSIS — M1712 Unilateral primary osteoarthritis, left knee: Secondary | ICD-10-CM | POA: Diagnosis not present

## 2019-05-31 DIAGNOSIS — M25562 Pain in left knee: Secondary | ICD-10-CM

## 2019-05-31 NOTE — Progress Notes (Addendum)
PCP: Arsenio Katz, NP  Subjective:   HPI: Patient is a 73 y.o. female here for left knee pain.  Patient was referred here from [facility] for Baker's cyst aspiration.  Patient was seen earlier this morning and had her knee aspirated due to pain and swelling.  She is been having work-up for her left knee pain done at [facility].  She does have a history of total knee replacement.  Last night she had an ultrasound of her left leg which was negative for DVT but it did show a small Baker's cyst measuring 1 cm x 1 cm x 2 cm.  Patient notes all of her pain is posterior.  It does not radiate.  She does have sensation of fullness behind her knee.  She denies any bruising.  She has no numbness or tingling.  Is a follow-up appointment at [facility] next week   Review of Systems: See HPI above.  Past Medical History:  Diagnosis Date  . COPD (chronic obstructive pulmonary disease) ([place]) 06/02/2016  . Food poisoning 2019   AT [facility]  . GERD (gastroesophageal reflux disease)   . Headache   . History of cardiac catheterization    Normal coronary arteries 2009  . Hyperlipidemia   . Middle cerebral artery aneurysm    Left - [facility] 2003  . Primary localized osteoarthritis of left knee 03/08/2019    Current Outpatient Medications on File Prior to Visit  Medication Sig Dispense Refill  . acetaminophen (TYLENOL) 500 MG tablet Take 500 mg by mouth every 6 (six) hours as needed for moderate pain or headache.    . Albuterol Sulfate 108 (90 Base) MCG/ACT AEPB Inhale 1 puff into the lungs every 6 (six) hours as needed (for shortness of breath/wheezing).     Marland Kitchen aspirin EC 325 MG EC tablet 1 tab a day for the next 30 days to prevent blood clots.  Patient to call if she decides to smoke and will change blood thinners 30 tablet 0  . docusate sodium (COLACE) 100 MG capsule 1 tab 2 times a day while on narcotics.  STOOL SOFTENER 60 capsule 0  . esomeprazole (NEXIUM) 40 MG capsule Take 40 mg by  mouth daily.     Marland Kitchen gabapentin (NEURONTIN) 300 MG capsule 1 tablet at night for nerve pain 30 capsule 0  . Multiple Vitamin (MULTIVITAMIN) tablet Take 1 tablet by mouth daily.    Marland Kitchen oxyCODONE (OXY IR/ROXICODONE) 5 MG immediate release tablet 1 po q 4 hrs prn pain 30 tablet 0  . polyethylene glycol (MIRALAX / GLYCOLAX) 17 g packet 17grams in 6 oz of something to drink twice a day until bowel movement.  LAXITIVE.  Restart if two days since last bowel movement 14 each 0  . potassium chloride (K-DUR) 10 MEQ tablet Take 10 mEq by mouth daily as needed (cramps).     No current facility-administered medications on file prior to visit.     Past Surgical History:  Procedure Laterality Date  . ABDOMINAL HYSTERECTOMY     1 OVARY LEFT  . BIOPSY  07/02/2017   Procedure: BIOPSY;  Surgeon: Rogene Houston, MD;  [facility];  Service: Endoscopy;;  colon  . BREAST SURGERY  1993   REDUCTION   . CATARACT EXTRACTION W/PHACO Right 02/01/2014   Procedure: CATARACT EXTRACTION PHACO AND INTRAOCULAR LENS PLACEMENT (IOC);  Surgeon: Tonny Branch, MD;  Location: [facility] ORS;  Service: Ophthalmology;  Laterality: Right;  CDE 7.59  . CATARACT EXTRACTION  W/PHACO Left 02/22/2014   Procedure: CATARACT EXTRACTION PHACO AND INTRAOCULAR LENS PLACEMENT (IOC);  Surgeon: Tonny Branch, MD;  Location: [facility] ORS;  Service: Ophthalmology;  Laterality: Left;  CDE 8.12  . Cerebral aneurysm surgery     2003 at [place]  . COLONOSCOPY N/A 07/02/2017   Procedure: COLONOSCOPY;  Surgeon: Rogene Houston, MD;  Location: [facility];  Service: Endoscopy;  Laterality: N/A;  7:30  . POLYPECTOMY  07/02/2017   Procedure: POLYPECTOMY;  Surgeon: Rogene Houston, MD;  Location[facility];  Service: Endoscopy;;  colon  . TOTAL KNEE ARTHROPLASTY Left 03/20/2019   Procedure: TOTAL KNEE ARTHROPLASTY;  Surgeon: Elsie Saas, MD;  Location: [facility];  Service: Orthopedics;  Laterality: Left;    No Known Allergies  Social  History   Socioeconomic History  . Marital status: Widowed    Spouse name: Not on file  . Number of children: Not on file  . Years of education: Not on file  . Highest education level: Not on file  Occupational History  . Not on file  Social Needs  . Financial resource strain: Not on file  . Food insecurity    Worry: Not on file    Inability: Not on file  . Transportation needs    Medical: Not on file    Non-medical: Not on file  Tobacco Use  . Smoking status: Current Every Day Smoker    Packs/day: 0.25    Years: 47.00    Pack years: 11.75    Types: Cigarettes  . Smokeless tobacco: Never Used  Substance and Sexual Activity  . Alcohol use: No  . Drug use: No  . Sexual activity: Yes  Lifestyle  . Physical activity    Days per week: Not on file    Minutes per session: Not on file  . Stress: Not on file  Relationships  . Social Herbalist on phone: Not on file    Gets together: Not on file    Attends religious service: Not on file    Active member of club or organization: Not on file    Attends meetings of clubs or organizations: Not on file    Relationship status: Not on file  . Intimate partner violence    Fear of current or ex partner: Not on file    Emotionally abused: Not on file    Physically abused: Not on file    Forced sexual activity: Not on file  Other Topics Concern  . Not on file  Social History Narrative  . Not on file    Family History  Problem Relation Age of Onset  . Breast cancer Mother   . Heart attack Mother   . Heart attack Father   . Diabetes Mellitus II Father   . Colon cancer Neg Hx         Objective:  Physical Exam: BP 139/78   Ht 5' 1.5" (1.562 m)   Wt 178 lb (80.7 kg)   BMI 33.09 kg/m  Gen: NAD, comfortable in exam room Lungs: Breathing comfortably on room air Knee Exam Left -Inspection: Scar in anterior knee from previous knee replacement -Palpation: Tenderness palpation the popliteal fossa -ROM: Extension:  0 degrees; Flexion: 190 degrees -Limb neurovascularly intact, no instability noted  Limited diagnostic ultrasound of the left knee Findings: -No Baker's cyst seen in the popliteal fossa -Small joint effusion noted Impression: -Negative for Baker's cyst   Assessment & Plan:  Patient is a 73 y.o. female here for left knee ultrasound  1.  Left knee pain status post total knee arthroscopy -Patient had knee aspiration done earlier this morning.  During this aspiration the Baker's cyst was also likely aspirated as well -No Baker's cyst was seen on ultrasound examination today -Further treatment and management will be referred to Dr. Noemi Chapel.  She has a follow-up scheduled with him next week  Follow-up here as needed  Addendum:  Patient seen in the office by fellow.  His history, exam, plan of care were precepted with me.  Karlton Lemon MD Kirt Boys

## 2019-06-06 DIAGNOSIS — M1712 Unilateral primary osteoarthritis, left knee: Secondary | ICD-10-CM | POA: Diagnosis not present

## 2019-06-07 DIAGNOSIS — M25562 Pain in left knee: Secondary | ICD-10-CM | POA: Diagnosis not present

## 2019-06-12 DIAGNOSIS — M25562 Pain in left knee: Secondary | ICD-10-CM | POA: Diagnosis not present

## 2019-08-02 DIAGNOSIS — F17210 Nicotine dependence, cigarettes, uncomplicated: Secondary | ICD-10-CM | POA: Diagnosis not present

## 2019-08-02 DIAGNOSIS — Z6834 Body mass index (BMI) 34.0-34.9, adult: Secondary | ICD-10-CM | POA: Diagnosis not present

## 2019-08-02 DIAGNOSIS — Z299 Encounter for prophylactic measures, unspecified: Secondary | ICD-10-CM | POA: Diagnosis not present

## 2019-08-02 DIAGNOSIS — J449 Chronic obstructive pulmonary disease, unspecified: Secondary | ICD-10-CM | POA: Diagnosis not present

## 2019-08-02 DIAGNOSIS — J441 Chronic obstructive pulmonary disease with (acute) exacerbation: Secondary | ICD-10-CM | POA: Diagnosis not present

## 2019-08-02 DIAGNOSIS — E7800 Pure hypercholesterolemia, unspecified: Secondary | ICD-10-CM | POA: Diagnosis not present

## 2019-08-07 ENCOUNTER — Other Ambulatory Visit: Payer: Self-pay

## 2019-08-07 ENCOUNTER — Emergency Department (HOSPITAL_COMMUNITY): Payer: Medicare Other

## 2019-08-07 ENCOUNTER — Emergency Department (HOSPITAL_COMMUNITY)
Admission: EM | Admit: 2019-08-07 | Discharge: 2019-08-08 | Discharge status: Against Medical Advice | Payer: Medicare Other | Attending: Emergency Medicine | Admitting: Emergency Medicine

## 2019-08-07 ENCOUNTER — Encounter (HOSPITAL_COMMUNITY): Payer: Self-pay | Admitting: Emergency Medicine

## 2019-08-07 DIAGNOSIS — J449 Chronic obstructive pulmonary disease, unspecified: Secondary | ICD-10-CM | POA: Insufficient documentation

## 2019-08-07 DIAGNOSIS — Z7982 Long term (current) use of aspirin: Secondary | ICD-10-CM | POA: Diagnosis not present

## 2019-08-07 DIAGNOSIS — R0609 Other forms of dyspnea: Secondary | ICD-10-CM

## 2019-08-07 DIAGNOSIS — R079 Chest pain, unspecified: Secondary | ICD-10-CM | POA: Insufficient documentation

## 2019-08-07 DIAGNOSIS — R0602 Shortness of breath: Secondary | ICD-10-CM | POA: Diagnosis not present

## 2019-08-07 DIAGNOSIS — I10 Essential (primary) hypertension: Secondary | ICD-10-CM | POA: Diagnosis not present

## 2019-08-07 DIAGNOSIS — R0789 Other chest pain: Secondary | ICD-10-CM | POA: Diagnosis not present

## 2019-08-07 DIAGNOSIS — R0600 Dyspnea, unspecified: Secondary | ICD-10-CM | POA: Insufficient documentation

## 2019-08-07 DIAGNOSIS — F17210 Nicotine dependence, cigarettes, uncomplicated: Secondary | ICD-10-CM | POA: Diagnosis not present

## 2019-08-07 DIAGNOSIS — Z6834 Body mass index (BMI) 34.0-34.9, adult: Secondary | ICD-10-CM | POA: Diagnosis not present

## 2019-08-07 DIAGNOSIS — Z79899 Other long term (current) drug therapy: Secondary | ICD-10-CM | POA: Insufficient documentation

## 2019-08-07 DIAGNOSIS — J441 Chronic obstructive pulmonary disease with (acute) exacerbation: Secondary | ICD-10-CM | POA: Diagnosis not present

## 2019-08-07 DIAGNOSIS — Z5329 Procedure and treatment not carried out because of patient's decision for other reasons: Secondary | ICD-10-CM | POA: Diagnosis not present

## 2019-08-07 DIAGNOSIS — Z299 Encounter for prophylactic measures, unspecified: Secondary | ICD-10-CM | POA: Diagnosis not present

## 2019-08-07 LAB — CBC WITH DIFFERENTIAL/PLATELET
Abs Immature Granulocytes: 0.12 10*3/uL — ABNORMAL HIGH (ref 0.00–0.07)
Basophils Absolute: 0 10*3/uL (ref 0.0–0.1)
Basophils Relative: 0 %
Eosinophils Absolute: 0 10*3/uL (ref 0.0–0.5)
Eosinophils Relative: 0 %
HCT: 42.4 % (ref 36.0–46.0)
Hemoglobin: 13.8 g/dL (ref 12.0–15.0)
Immature Granulocytes: 1 %
Lymphocytes Relative: 15 %
Lymphs Abs: 1.6 10*3/uL (ref 0.7–4.0)
MCH: 29.8 pg (ref 26.0–34.0)
MCHC: 32.5 g/dL (ref 30.0–36.0)
MCV: 91.6 fL (ref 80.0–100.0)
Monocytes Absolute: 0.5 10*3/uL (ref 0.1–1.0)
Monocytes Relative: 5 %
Neutro Abs: 8.3 10*3/uL — ABNORMAL HIGH (ref 1.7–7.7)
Neutrophils Relative %: 79 %
Platelets: 241 10*3/uL (ref 150–400)
RBC: 4.63 MIL/uL (ref 3.87–5.11)
RDW: 14.6 % (ref 11.5–15.5)
WBC: 10.5 10*3/uL (ref 4.0–10.5)
nRBC: 0 % (ref 0.0–0.2)

## 2019-08-07 LAB — COMPREHENSIVE METABOLIC PANEL
ALT: 18 U/L (ref 0–44)
AST: 19 U/L (ref 15–41)
Albumin: 3.8 g/dL (ref 3.5–5.0)
Alkaline Phosphatase: 69 U/L (ref 38–126)
Anion gap: 8 (ref 5–15)
BUN: 20 mg/dL (ref 8–23)
CO2: 25 mmol/L (ref 22–32)
Calcium: 8.8 mg/dL — ABNORMAL LOW (ref 8.9–10.3)
Chloride: 106 mmol/L (ref 98–111)
Creatinine, Ser: 0.74 mg/dL (ref 0.44–1.00)
GFR calc Af Amer: >60 mL/min (ref >60)
GFR calc non Af Amer: >60 mL/min (ref >60)
Glucose, Bld: 119 mg/dL — ABNORMAL HIGH (ref 70–99)
Potassium: 4.2 mmol/L (ref 3.5–5.1)
Sodium: 139 mmol/L (ref 135–145)
Total Bilirubin: 0.6 mg/dL (ref 0.3–1.2)
Total Protein: 6.5 g/dL (ref 6.5–8.1)

## 2019-08-07 LAB — TROPONIN I (HIGH SENSITIVITY)
Troponin I (High Sensitivity): 3 ng/L (ref <18)
Troponin I (High Sensitivity): 2 ng/L (ref <18)

## 2019-08-07 LAB — D-DIMER, QUANTITATIVE: D-Dimer, Quant: 1.55 ug/mL-FEU — ABNORMAL HIGH (ref 0.00–0.50)

## 2019-08-07 MED ORDER — PREDNISONE 50 MG PO TABS
60.0000 mg | ORAL_TABLET | Freq: Once | ORAL | Status: AC
  Administered 2019-08-07: 60 mg via ORAL
  Filled 2019-08-07: qty 1

## 2019-08-07 MED ORDER — IOHEXOL 350 MG/ML SOLN
100.0000 mL | Freq: Once | INTRAVENOUS | Status: AC | PRN
  Administered 2019-08-07: 23:00:00 100 mL via INTRAVENOUS

## 2019-08-07 MED ORDER — METHYLPREDNISOLONE SODIUM SUCC 125 MG IJ SOLR: 125.0000 mg | Freq: Once | INTRAMUSCULAR | Status: DC

## 2019-08-07 MED ORDER — ALBUTEROL SULFATE HFA 108 (90 BASE) MCG/ACT IN AERS
6.0000 | INHALATION_SPRAY | Freq: Once | RESPIRATORY_TRACT | Status: AC
  Administered 2019-08-07: 6 via RESPIRATORY_TRACT
  Filled 2019-08-07: qty 6.7

## 2019-08-07 NOTE — ED Provider Notes (Addendum)
[place] Provider Note   CSN: [id number] Arrival date & time: 08/07/19  1739     History Chief Complaint  Patient presents with  . Shortness of Breath    since last Wed  . Chest Pain    since last Wed    Jill Braun is a 74 y.o. female.  Patient complains of shortness of breath and chest pain for last couple days.  The pain is running down to her left arm.  The history is provided by the patient. No language interpreter was used.  Shortness of Breath Severity:  Moderate Onset quality:  Sudden Timing:  Constant Progression:  Worsening Chronicity:  New Context: activity   Relieved by:  Nothing Worsened by:  Nothing Associated symptoms: chest pain   Associated symptoms: no abdominal pain, no cough, no headaches and no rash   Chest Pain Associated symptoms: shortness of breath   Associated symptoms: no abdominal pain, no back pain, no cough, no fatigue and no headache        Past Medical History:  Diagnosis Date  . COPD (chronic obstructive pulmonary disease) ([place]) 06/02/2016  . Food poisoning 2019   AT [facility]  . GERD (gastroesophageal reflux disease)   . Headache   . History of cardiac catheterization    Normal coronary arteries 2009  . Hyperlipidemia   . Middle cerebral artery aneurysm    Left - [facility] 2003  . Primary localized osteoarthritis of left knee 03/08/2019    Patient Active Problem List   Diagnosis Date Noted  . Primary localized osteoarthritis of left knee 03/08/2019  . Middle cerebral artery aneurysm   . Essential hypertension   . Irritable bowel syndrome with diarrhea 06/01/2017  . GERD (gastroesophageal reflux disease) 06/02/2016  . COPD exacerbation ([place]) 06/02/2016    Past Surgical History:  Procedure Laterality Date  . ABDOMINAL HYSTERECTOMY     1 OVARY LEFT  . BIOPSY  07/02/2017   Procedure: BIOPSY;  Surgeon: Rogene Houston, MD;  [facility];  Service: Endoscopy;;  colon  . BREAST  SURGERY  1993   REDUCTION   . CATARACT EXTRACTION W/PHACO Right 02/01/2014   Procedure: CATARACT EXTRACTION PHACO AND INTRAOCULAR LENS PLACEMENT (IOC);  Surgeon: Tonny Branch, MD;  Location: [facility];  Service: Ophthalmology;  Laterality: Right;  CDE 7.59  . CATARACT EXTRACTION W/PHACO Left 02/22/2014   Procedure: CATARACT EXTRACTION PHACO AND INTRAOCULAR LENS PLACEMENT (IOC);  Surgeon: Tonny Branch, MD;  Location: [facility];  Service: Ophthalmology;  Laterality: Left;  CDE 8.12  . Cerebral aneurysm surgery     2003 at [place]  . COLONOSCOPY N/A 07/02/2017   Procedure: COLONOSCOPY;  Surgeon: Rogene Houston, MD;  Location[facility];  Service: Endoscopy;  Laterality: N/A;  7:30  . POLYPECTOMY  07/02/2017   Procedure: POLYPECTOMY;  Surgeon: Rogene Houston, MD;  [facility];  Service: Endoscopy;;  colon  . TOTAL KNEE ARTHROPLASTY Left 03/20/2019   Procedure: TOTAL KNEE ARTHROPLASTY;  Surgeon: Elsie Saas, MD;  [facility];  Service: Orthopedics;  Laterality: Left;     OB History   No obstetric history on file.     Family History  Problem Relation Age of Onset  . Breast cancer Mother   . Heart attack Mother   . Heart attack Father   . Diabetes Mellitus II Father   . Colon cancer Neg Hx     Social History  Tobacco Use  . Smoking status: Current Every Day Smoker    Packs/day: 0.25    Years: 47.00    Pack years: 11.75    Types: Cigarettes  . Smokeless tobacco: Never Used  Substance Use Topics  . Alcohol use: No  . Drug use: No    Home Medications Prior to Admission medications   Medication Sig Start Date End Date Taking? Authorizing Provider  acetaminophen (TYLENOL) 500 MG tablet Take 500 mg by mouth every 6 (six) hours as needed for moderate pain or headache.    [provider]  Albuterol Sulfate 108 (90 Base) MCG/ACT AEPB Inhale 1 puff into the lungs every 6 (six) hours as needed (for shortness of breath/wheezing).      [provider]  aspirin EC 325 MG EC tablet 1 tab a day for the next 30 days to prevent blood clots.  Patient to call if she decides to smoke and will change blood thinners 03/21/19   Shepperson, Kirstin, PA-C  docusate sodium (COLACE) 100 MG capsule 1 tab 2 times a day while on narcotics.  STOOL SOFTENER 03/21/19   Shepperson, Kirstin, PA-C  esomeprazole (NEXIUM) 40 MG capsule Take 40 mg by mouth daily.     [provider]  gabapentin (NEURONTIN) 300 MG capsule 1 tablet at night for nerve pain 03/21/19   Shepperson, Kirstin, PA-C  Multiple Vitamin (MULTIVITAMIN) tablet Take 1 tablet by mouth daily.    [provider]  oxyCODONE (OXY IR/ROXICODONE) 5 MG immediate release tablet 1 po q 4 hrs prn pain 03/21/19   Shepperson, Kirstin, PA-C  polyethylene glycol (MIRALAX / GLYCOLAX) 17 g packet 17grams in 6 oz of something to drink twice a day until bowel movement.  LAXITIVE.  Restart if two days since last bowel movement 03/21/19   Shepperson, Kirstin, PA-C  potassium chloride (K-DUR) 10 MEQ tablet Take 10 mEq by mouth daily as needed (cramps).    [provider]    Allergies    Patient has no known allergies.  Review of Systems   Review of Systems  Constitutional: Negative for appetite change and fatigue.  HENT: Negative for congestion, ear discharge and sinus pressure.   Eyes: Negative for discharge.  Respiratory: Positive for shortness of breath. Negative for cough.   Cardiovascular: Positive for chest pain.  Gastrointestinal: Negative for abdominal pain and diarrhea.  Genitourinary: Negative for frequency and hematuria.  Musculoskeletal: Negative for back pain.  Skin: Negative for rash.  Neurological: Negative for seizures and headaches.  Psychiatric/Behavioral: Negative for hallucinations.    Physical Exam Updated Vital Signs BP 126/71   Pulse (!) 56   Temp 97.8 F (36.6 C) (Oral)   Resp 20   Ht 5\' 1"  (1.549 m)   Wt 84.8 kg   SpO2 93%   BMI 35.33  kg/m   Physical Exam Vitals reviewed.  Constitutional:      Appearance: She is well-developed.  HENT:     Head: Normocephalic.     Nose: Nose normal.  Eyes:     General: No scleral icterus.    Conjunctiva/sclera: Conjunctivae normal.  Neck:     Thyroid: No thyromegaly.  Cardiovascular:     Rate and Rhythm: Normal rate and regular rhythm.     Heart sounds: No murmur. No friction rub. No gallop.   Pulmonary:     Breath sounds: No stridor. No wheezing or rales.  Chest:     Chest wall: No tenderness.  Abdominal:     General:  There is no distension.     Tenderness: There is no abdominal tenderness. There is no rebound.  Musculoskeletal:        General: Normal range of motion.     Cervical back: Neck supple.  Lymphadenopathy:     Cervical: No cervical adenopathy.  Skin:    Findings: No erythema or rash.  Neurological:     Mental Status: She is alert and oriented to person, place, and time.     Motor: No abnormal muscle tone.     Coordination: Coordination normal.  Psychiatric:        Behavior: Behavior normal.     ED Results / Procedures / Treatments   Labs (all labs ordered are listed, but only abnormal results are displayed) Labs Reviewed  CBC WITH DIFFERENTIAL/PLATELET - Abnormal; Notable for the following components:      Result Value   Neutro Abs 8.3 (*)    Abs Immature Granulocytes 0.12 (*)    All other components within normal limits  COMPREHENSIVE METABOLIC PANEL - Abnormal; Notable for the following components:   Glucose, Bld 119 (*)    Calcium 8.8 (*)    All other components within normal limits  D-DIMER, QUANTITATIVE (NOT AT [facility]) - Abnormal; Notable for the following components:   D-Dimer, Quant 1.55 (*)    All other components within normal limits  TROPONIN I (HIGH SENSITIVITY)  TROPONIN I (HIGH SENSITIVITY)    EKG EKG Interpretation  Date/Time:  Monday August 07 2019 18:59:02 EST Ventricular Rate:  91 PR Interval:    QRS Duration: 122 QT  Interval:  375 QTC Calculation: 462 R Axis:   66 Text Interpretation: Sinus rhythm Right bundle branch block Confirmed by Milton Ferguson [phone]) on 08/07/2019 11:15:56 PM Also confirmed by Milton Ferguson [phone])  on 08/07/2019 11:22:58 PM   Radiology CT Angio Chest PE W and/or Wo Contrast  Result Date: 08/07/2019 CLINICAL DATA:  Chest pain and shortness of breath for 5 days, elevated D-dimer EXAM: CT ANGIOGRAPHY CHEST WITH CONTRAST TECHNIQUE: Multidetector CT imaging of the chest was performed using the standard protocol during bolus administration of intravenous contrast. Multiplanar CT image reconstructions and MIPs were obtained to evaluate the vascular anatomy. CONTRAST:  174mL OMNIPAQUE IOHEXOL 350 MG/ML SOLN COMPARISON:  07/21/2017, 08/07/2019 FINDINGS: Cardiovascular: This is a technically adequate evaluation of the pulmonary vasculature. There are no filling defects or pulmonary emboli. Heart is unremarkable without pericardial effusion. No evidence of thoracic aortic aneurysm or dissection. Minimal atherosclerosis. Mediastinum/Nodes: Stable cyst left lobe thyroid. No pathologic mediastinal or hilar adenopathy. Lungs/Pleura: No acute airspace disease, effusion, or pneumothorax. The central airways are widely patent. Upper Abdomen: Limited imaging through the upper abdomen is unremarkable. Musculoskeletal: There are no acute displaced fractures. Reconstructed images demonstrate stable midthoracic spondylosis. Review of the MIP images confirms the above findings. IMPRESSION: 1. No evidence of pulmonary embolus. 2. No acute intrathoracic process. 3.  Aortic Atherosclerosis (ICD10-I70.0). Electronically Signed   By: Randa Ngo M.D.   On: 08/07/2019 23:07   DG Chest Port 1 View  Result Date: 08/07/2019 CLINICAL DATA:  74 year old female with shortness of breath and chest pain. EXAM: PORTABLE CHEST 1 VIEW COMPARISON:  Chest radiograph dated 03/13/2019. FINDINGS: There is mild cardiomegaly and vascular  congestion. No edema. No focal consolidation, pleural effusion, or pneumothorax. Atherosclerotic calcification of the aorta. No acute osseous pathology. IMPRESSION: Mild cardiomegaly and vascular congestion. No focal consolidation or edema. Electronically Signed   By: Anner Crete M.D.   On: 08/07/2019  18:57    Procedures Procedures (including critical care time)  Medications Ordered in ED Medications  methylPREDNISolone sodium succinate (SOLU-MEDROL) 125 mg/2 mL injection 125 mg (has no administration in time range)  albuterol (VENTOLIN HFA) 108 (90 Base) MCG/ACT inhaler 6 puff (has no administration in time range)  iohexol (OMNIPAQUE) 350 MG/ML injection 100 mL (100 mLs Intravenous Contrast Given 08/07/19 2245)    ED Course  I have reviewed the triage vital signs and the nursing notes.  Pertinent labs & imaging results that were available during my care of the patient were reviewed by me and considered in my medical decision making (see chart for details).    MDM Rules/Calculators/A&P                      Labs unremarkable.  Patient continues to have significant shortness of breath when she ambulates.  CT scan of the chest is unremarkable.  She is having an exacerbation of her COPD with possible coronary artery problems.  She will be admitted for further work-up   patient refused admission.  She will leave AMA.  She is referred to cardiology Final Clinical Impression(s) / ED Diagnoses Final diagnoses:  SOB (shortness of breath)    Rx / DC Orders ED Discharge Orders    None       Milton Ferguson, MD 08/07/19 [id number]    Milton Ferguson, MD 08/07/19 2342

## 2019-08-07 NOTE — ED Notes (Signed)
Pt ambulated to bathroom on Room Air. Upon return pt O2 sats 96-97%. RR at 30 breaths per minute.

## 2019-08-07 NOTE — Discharge Instructions (Addendum)
Follow-up with your family doctor this week and follow-up with the cardiologist Dr. Bronson Ing or one of his family doctor.  Return if problems

## 2019-08-07 NOTE — ED Triage Notes (Signed)
C/o chest pain and SOB since last Wed. Seen by PCP last Tuesday and today Arsenio Katz, MD from [facility]).  C/o SOB and Chest pain radiating to left arm, rating pain 6/10.  C/o nausea on Saturday and Sunday.

## 2019-10-03 DIAGNOSIS — M1712 Unilateral primary osteoarthritis, left knee: Secondary | ICD-10-CM | POA: Diagnosis not present

## 2019-10-11 DIAGNOSIS — F17210 Nicotine dependence, cigarettes, uncomplicated: Secondary | ICD-10-CM | POA: Diagnosis not present

## 2019-10-11 DIAGNOSIS — J449 Chronic obstructive pulmonary disease, unspecified: Secondary | ICD-10-CM | POA: Diagnosis not present

## 2019-10-11 DIAGNOSIS — R609 Edema, unspecified: Secondary | ICD-10-CM | POA: Diagnosis not present

## 2019-10-11 DIAGNOSIS — R5383 Other fatigue: Secondary | ICD-10-CM | POA: Diagnosis not present

## 2019-10-11 DIAGNOSIS — Z6835 Body mass index (BMI) 35.0-35.9, adult: Secondary | ICD-10-CM | POA: Diagnosis not present

## 2019-10-11 DIAGNOSIS — Z299 Encounter for prophylactic measures, unspecified: Secondary | ICD-10-CM | POA: Diagnosis not present

## 2019-10-11 DIAGNOSIS — J441 Chronic obstructive pulmonary disease with (acute) exacerbation: Secondary | ICD-10-CM | POA: Diagnosis not present

## 2019-11-01 DIAGNOSIS — Z6838 Body mass index (BMI) 38.0-38.9, adult: Secondary | ICD-10-CM | POA: Diagnosis not present

## 2019-11-01 DIAGNOSIS — Z0000 Encounter for general adult medical examination without abnormal findings: Secondary | ICD-10-CM | POA: Diagnosis not present

## 2019-11-01 DIAGNOSIS — Z1211 Encounter for screening for malignant neoplasm of colon: Secondary | ICD-10-CM | POA: Diagnosis not present

## 2019-11-01 DIAGNOSIS — Z1331 Encounter for screening for depression: Secondary | ICD-10-CM | POA: Diagnosis not present

## 2019-11-01 DIAGNOSIS — Z7189 Other specified counseling: Secondary | ICD-10-CM | POA: Diagnosis not present

## 2019-11-01 DIAGNOSIS — Z299 Encounter for prophylactic measures, unspecified: Secondary | ICD-10-CM | POA: Diagnosis not present

## 2019-11-01 DIAGNOSIS — Z1339 Encounter for screening examination for other mental health and behavioral disorders: Secondary | ICD-10-CM | POA: Diagnosis not present

## 2019-11-15 DIAGNOSIS — J449 Chronic obstructive pulmonary disease, unspecified: Secondary | ICD-10-CM | POA: Diagnosis not present

## 2019-11-15 DIAGNOSIS — L0390 Cellulitis, unspecified: Secondary | ICD-10-CM | POA: Diagnosis not present

## 2019-11-15 DIAGNOSIS — Z299 Encounter for prophylactic measures, unspecified: Secondary | ICD-10-CM | POA: Diagnosis not present

## 2019-11-15 DIAGNOSIS — R600 Localized edema: Secondary | ICD-10-CM | POA: Diagnosis not present

## 2019-11-15 DIAGNOSIS — J441 Chronic obstructive pulmonary disease with (acute) exacerbation: Secondary | ICD-10-CM | POA: Diagnosis not present

## 2019-11-28 DIAGNOSIS — M7122 Synovial cyst of popliteal space [Baker], left knee: Secondary | ICD-10-CM | POA: Diagnosis not present

## 2019-11-28 DIAGNOSIS — M1712 Unilateral primary osteoarthritis, left knee: Secondary | ICD-10-CM | POA: Diagnosis not present

## 2019-11-29 ENCOUNTER — Other Ambulatory Visit (HOSPITAL_COMMUNITY): Payer: Self-pay | Admitting: Orthopedic Surgery

## 2019-11-29 DIAGNOSIS — M7122 Synovial cyst of popliteal space [Baker], left knee: Secondary | ICD-10-CM

## 2019-12-04 ENCOUNTER — Ambulatory Visit (HOSPITAL_COMMUNITY)
Admission: RE | Admit: 2019-12-04 | Discharge: 2019-12-04 | Disposition: A | Discharge status: Home / Self Care | Payer: Medicare Other | Source: Ambulatory Visit | Attending: Orthopedic Surgery | Admitting: Orthopedic Surgery

## 2019-12-04 ENCOUNTER — Other Ambulatory Visit: Payer: Self-pay

## 2019-12-04 DIAGNOSIS — M7122 Synovial cyst of popliteal space [Baker], left knee: Secondary | ICD-10-CM | POA: Diagnosis not present

## 2019-12-04 DIAGNOSIS — M79662 Pain in left lower leg: Secondary | ICD-10-CM | POA: Diagnosis not present

## 2019-12-04 DIAGNOSIS — M79605 Pain in left leg: Secondary | ICD-10-CM | POA: Diagnosis not present

## 2019-12-13 DIAGNOSIS — Z6837 Body mass index (BMI) 37.0-37.9, adult: Secondary | ICD-10-CM | POA: Diagnosis not present

## 2019-12-13 DIAGNOSIS — R600 Localized edema: Secondary | ICD-10-CM | POA: Diagnosis not present

## 2019-12-13 DIAGNOSIS — Z299 Encounter for prophylactic measures, unspecified: Secondary | ICD-10-CM | POA: Diagnosis not present

## 2019-12-13 DIAGNOSIS — R0602 Shortness of breath: Secondary | ICD-10-CM | POA: Diagnosis not present

## 2019-12-13 DIAGNOSIS — J449 Chronic obstructive pulmonary disease, unspecified: Secondary | ICD-10-CM | POA: Diagnosis not present

## 2019-12-15 ENCOUNTER — Encounter: Payer: Self-pay | Admitting: Pulmonary Disease

## 2020-01-11 DIAGNOSIS — Z1231 Encounter for screening mammogram for malignant neoplasm of breast: Secondary | ICD-10-CM | POA: Diagnosis not present

## 2020-01-15 DIAGNOSIS — R0600 Dyspnea, unspecified: Secondary | ICD-10-CM | POA: Diagnosis not present

## 2020-01-26 ENCOUNTER — Encounter: Payer: Self-pay | Admitting: Internal Medicine

## 2020-01-26 ENCOUNTER — Other Ambulatory Visit (HOSPITAL_COMMUNITY)
Admission: RE | Admit: 2020-01-26 | Discharge: 2020-01-26 | Disposition: A | Discharge status: Home / Self Care | Payer: Medicare Other | Source: Ambulatory Visit | Attending: Internal Medicine | Admitting: Internal Medicine

## 2020-01-26 ENCOUNTER — Ambulatory Visit (INDEPENDENT_AMBULATORY_CARE_PROVIDER_SITE_OTHER): Payer: Medicare Other | Admitting: Internal Medicine

## 2020-01-26 ENCOUNTER — Other Ambulatory Visit: Payer: Self-pay

## 2020-01-26 DIAGNOSIS — F17210 Nicotine dependence, cigarettes, uncomplicated: Secondary | ICD-10-CM | POA: Diagnosis not present

## 2020-01-26 DIAGNOSIS — R0609 Other forms of dyspnea: Secondary | ICD-10-CM

## 2020-01-26 DIAGNOSIS — J449 Chronic obstructive pulmonary disease, unspecified: Secondary | ICD-10-CM

## 2020-01-26 DIAGNOSIS — R0600 Dyspnea, unspecified: Secondary | ICD-10-CM | POA: Diagnosis not present

## 2020-01-26 DIAGNOSIS — R05 Cough: Secondary | ICD-10-CM | POA: Diagnosis not present

## 2020-01-26 DIAGNOSIS — R058 Other specified cough: Secondary | ICD-10-CM | POA: Insufficient documentation

## 2020-01-26 LAB — CBC WITH DIFFERENTIAL/PLATELET
Abs Immature Granulocytes: 0.03 10*3/uL (ref 0.00–0.07)
Basophils Absolute: 0 10*3/uL (ref 0.0–0.1)
Basophils Relative: 1 %
Eosinophils Absolute: 0.3 10*3/uL (ref 0.0–0.5)
Eosinophils Relative: 4 %
HCT: 41.2 % (ref 36.0–46.0)
Hemoglobin: 13.3 g/dL (ref 12.0–15.0)
Immature Granulocytes: 1 %
Lymphocytes Relative: 35 %
Lymphs Abs: 2.3 10*3/uL (ref 0.7–4.0)
MCH: 29.8 pg (ref 26.0–34.0)
MCHC: 32.3 g/dL (ref 30.0–36.0)
MCV: 92.4 fL (ref 80.0–100.0)
Monocytes Absolute: 0.6 10*3/uL (ref 0.1–1.0)
Monocytes Relative: 9 %
Neutro Abs: 3.4 10*3/uL (ref 1.7–7.7)
Neutrophils Relative %: 50 %
Platelets: 218 10*3/uL (ref 150–400)
RBC: 4.46 MIL/uL (ref 3.87–5.11)
RDW: 14.5 % (ref 11.5–15.5)
WBC: 6.5 10*3/uL (ref 4.0–10.5)
nRBC: 0 % (ref 0.0–0.2)

## 2020-01-26 LAB — TSH: TSH: 0.875 u[IU]/mL (ref 0.350–4.500)

## 2020-01-26 LAB — BRAIN NATRIURETIC PEPTIDE: B Natriuretic Peptide: 64 pg/mL (ref 0.0–100.0)

## 2020-01-26 MED ORDER — BREZTRI AEROSPHERE 160-9-4.8 MCG/ACT IN AERO: 2.0000 | INHALATION_SPRAY | Freq: Two times a day (BID) | RESPIRATORY_TRACT | 0 refills | Status: DC

## 2020-01-26 NOTE — Assessment & Plan Note (Addendum)

## 2020-01-26 NOTE — Assessment & Plan Note (Addendum)
Active smoker - 01/26/2020  After extensive coaching inhaler device,  effectiveness =    50% from a baseline of 10 % > try breztri one bid due to concerns with uacs  - Allergy profile 01/26/2020 >  Eos 0.3 /  IgE    Abrupt onset of refractory symptoms is not typical of copd or asthma but clearly has airway issues and ? Severity copd.   DDX of  difficult airways management almost all start with A and  include Adherence, Ace Inhibitors, Acid Reflux, Active Sinus Disease, Alpha 1 Antitripsin deficiency, Anxiety masquerading as Airways dz,  ABPA,  Allergy(esp in young), Aspiration (esp in elderly), Adverse effects of meds,  Active smoking or vaping, A bunch of PE's (a small clot burden can't cause this syndrome unless there is already severe underlying pulm or vascular dz with poor reserve) plus two Bs  = Bronchiectasis and Beta blocker use..and one C= CHF   Adherence is always the initial "prime suspect" and is a multilayered concern that requires a "trust but verify" approach in every patient - starting with knowing how to use medications, especially inhalers, correctly, keeping up with refills and understanding the fundamental difference between maintenance and prns vs those medications only taken for a very short course and then stopped and not refilled.  - see hfa teaching -return with all meds in hand using a trust but verify approach to confirm accurate Medication  Reconciliation The principal here is that until we are certain that the  patients are doing what we've asked, it makes no sense to ask them to do more.   ? Acid (or non-acid) GERD > always difficult to exclude as up to 75% of pts in some series report no assoc GI/ Heartburn symptoms> rec max (24h)  acid suppression and diet restrictions/ reviewed and instructions given in writing.   Active smoking also at top of list of usual suspects  (see separate a/p)   ? Adverse drug reaction > none listed, including ACEi > will check pharmacy  records  ? Allergy / asthma > just use low dose breztri until sort this out, check IgE   ? ABPA  Check IgE   ? Anxiety > usually at the bottom of this list of usual suspects and may interfere with adherence and also interpretation of response or lack thereof to symptom management which can be quite subjective.   ? A bunch of PE's > neg Cta at onset despite pos d dimer which is non specific and was already eval with neg venous dopplers as well   ? ChF/ cardiac asthma > bnp nl rules out

## 2020-01-26 NOTE — Patient Instructions (Signed)
Plan A = Automatic = Always=    Breztri one twice daily immediately when get up and 12 hours later   Work on inhaler technique:  relax and gently blow all the way out then take a nice smooth deep breath back in, triggering the inhaler at same time you start breathing in.  Hold for up to 5 seconds if you can. Blow out thru nose. Rinse and gargle with water when done - arm and hammer toothpaste make a slurry and gargle.     Plan B = Backup (to supplement plan A, not to replace it) Only use your albuterol inhaler as a rescue medication to be used if you can't catch your breath by resting or doing a relaxed purse lip breathing pattern.  - The less you use it, the better it will work when you need it. - Ok to use the inhaler up to 2 puffs  every 4 hours if you must but call for appointment if use goes up over your usual need - Don't leave home without it !!  (think of it like the spare tire for your car)   Plan C = Crisis (instead of Plan B but only if Plan B stops working) - only use your albuterol nebulizer if you first try Plan B and it fails to help > ok to use the nebulizer up to every 4 hours but if start needing it regularly call for immediate appointment   Try albuterol 15 min before an activity that you know would make you short of breath and see if it makes any difference and if makes none then don't take it after activity unless you can't catch your breath.     Please remember to go to the lab department  [place]  for your tests - we will call you with the results when they are available.      Please schedule a follow up office visit in 4 weeks, sooner if needed

## 2020-01-26 NOTE — Assessment & Plan Note (Signed)
Did not walk pt today due to clear evidence of upper airway obstruction - will do on return

## 2020-01-26 NOTE — Progress Notes (Addendum)
Jill Braun, female    DOB: [birth year]     MRN: [id number]   Brief patient profile:  51 yowf active smoker onset in her 23s recurrent episodes of bronchitis and ok in between with seasonal rhinitis x 2019 spring much worse since [place] [facility] 07/2019 much worse sob even when not working and new rx by Dr Manuella Ghazi =  Albuterol Jill Braun and only a little better and then added breztri only uses prn so referred to pulmonary clinic in [place]  01/26/2020 by Jill Katz np    History of Present Illness  01/26/2020  Pulmonary/ 1st office eval/ Jill Braun / [facility] Office  Chief Complaint  Patient presents with  . Pulmonary Consult    Referred by Dr. Manuella Ghazi. Pt c/o DOE x 5 months- started after exposed to paint fumes. Sometimes gets winded just walking room to room. Also c/o cough- non prod.   Dyspnea:  Room to room s assoc cp  But  audible wheeze min better p saba hfa / neb  Cough: when get hot  Sleep: flat 2 pillows, wakes up after an hour or two smothing and sets up  Prednisone not much better, gained 30 lb   No obvious day to day or daytime variability or assoc excess/ purulent sputum or mucus plugs or hemoptysis or cp or chest tightness,  overt sinus or hb symptoms.   Also denies any obvious fluctuation of symptoms with weather or environmental changes or other aggravating or alleviating factors except as outlined above   No unusual exposure hx or h/o childhood pna/ asthma or knowledge of premature birth.  Current Allergies, Complete Past Medical History, Past Surgical History, Family History, and Social History were reviewed in [employer] record.  ROS  The following are not active complaints unless bolded Hoarseness, sore throat, dysphagia, dental problems, itching, sneezing,  nasal congestion or discharge of excess mucus or purulent secretions, ear ache,   fever, chills, sweats, unintended wt loss or wt gain, classically pleuritic or exertional cp,  orthopnea pnd  or arm/hand swelling  or leg swelling, presyncope, palpitations, abdominal pain, anorexia, nausea, vomiting, diarrhea  or change in bowel habits or change in bladder habits, change in stools or change in urine, dysuria, hematuria,  rash, arthralgias, visual complaints, headache, numbness, weakness or ataxia or problems with walking or coordination,  change in mood or  memory.             Past Medical History:  Diagnosis Date  . COPD (chronic obstructive pulmonary disease) ([place]) 06/02/2016  . Food poisoning 2019   AT [facility]  . GERD (gastroesophageal reflux disease)   . Headache   . History of cardiac catheterization    Normal coronary arteries 2009  . Hyperlipidemia   . Middle cerebral artery aneurysm    Left - [facility] 2003  . Primary localized osteoarthritis of left knee 03/08/2019    Outpatient Medications Prior to Visit  Medication Sig Dispense Refill  . acetaminophen (TYLENOL) 500 MG tablet Take 500 mg by mouth every 6 (six) hours as needed for moderate pain or headache.    . albuterol (ACCUNEB) 1.25 MG/3ML nebulizer solution Take 1 ampule by nebulization every 6 (six) hours as needed for wheezing.    . Albuterol Sulfate 108 (90 Base) MCG/ACT AEPB Inhale 1 puff into the lungs every 6 (six) hours as needed (for shortness of breath/wheezing).     Marland Kitchen esomeprazole (NEXIUM) 40 MG capsule Take 40 mg by mouth daily.     Marland Kitchen  furosemide (LASIX) 20 MG tablet Take 20 mg by mouth daily.    Marland Kitchen loratadine (CLARITIN) 10 MG tablet Take 10 mg by mouth daily.    . potassium chloride (K-DUR) 10 MEQ tablet Take 10 mEq by mouth daily as needed (cramps).    . temazepam (RESTORIL) 15 MG capsule Take 15 mg by mouth at bedtime as needed for sleep.    Marland Kitchen aspirin EC 325 MG EC tablet 1 tab a day for the next 30 days to prevent blood clots.  Patient to call if she decides to smoke and will change blood thinners 30 tablet 0  . docusate sodium (COLACE) 100 MG capsule 1 tab 2 times a day while on narcotics.   STOOL SOFTENER 60 capsule 0  . gabapentin (NEURONTIN) 300 MG capsule 1 tablet at night for nerve pain 30 capsule 0  . Multiple Vitamin (MULTIVITAMIN) tablet Take 1 tablet by mouth daily.    Marland Kitchen oxyCODONE (OXY IR/ROXICODONE) 5 MG immediate release tablet 1 po q 4 hrs prn pain 30 tablet 0  . polyethylene glycol (MIRALAX / GLYCOLAX) 17 g packet 17grams in 6 oz of something to drink twice a day until bowel movement.  LAXITIVE.  Restart if two days since last bowel movement 14 each 0   No facility-administered medications prior to visit.     Objective:     BP (!) 142/84 (BP Location: Left Arm, Cuff Size: Normal)   Pulse 90   Temp 97.8 F (36.6 C) (Oral)   Ht 5\' 1"  (1.549 m)   Wt 198 lb (89.8 kg)   SpO2 95% Comment: on ra  BMI 37.41 kg/m   SpO2: 95 % (on ra)  Obese pleasant wf nad with classic pseudowheeze much worse with fvc maneuver   HEENT : pt wearing mask not removed for exam due to covid - 19 concerns.   NECK :  without JVD/Nodes/TM/ nl carotid upstrokes bilaterally   LUNGS: no acc muscle use,  Min barrel  contour chest wall with bilateral  slightly decreased bs s audible wheeze and  without cough on insp or exp maneuvers and min  Hyperresonant  to  percussion bilaterally     CV:  RRR  no s3 or murmur or increase in P2, and trace edema  L > R LE    ABD: obese  soft and nontender with pos end  insp Hoover's  in the supine position. No bruits or organomegaly appreciated, bowel sounds nl  MS:   Nl gait/  ext warm without deformities, calf tenderness, cyanosis or clubbing No obvious joint restrictions   SKIN: warm and dry without lesions    NEURO:  alert, approp, nl sensorium with  no motor or cerebellar deficits apparent.         I personally reviewed images and agree with radiology impression as follows:   Chest CTa  08/07/19 1. No evidence of pulmonary embolus. 2. No acute intrathoracic process.   Labs ordered/ reviewed:     Chemistry      Component Value Date/Time    NA 139 08/07/2019 1914   K 4.2 08/07/2019 1914   CL 106 08/07/2019 1914   CO2 25 08/07/2019 1914   BUN 20 08/07/2019 1914   CREATININE 0.74 08/07/2019 1914      Component Value Date/Time   CALCIUM 8.8 (L) 08/07/2019 1914   ALKPHOS 69 08/07/2019 1914   AST 19 08/07/2019 1914   ALT 18 08/07/2019 1914   BILITOT 0.6 08/07/2019 1914  Lab Results  Component Value Date   WBC 6.5 01/26/2020   HGB 13.3 01/26/2020   HCT 41.2 01/26/2020   MCV 92.4 01/26/2020   PLT 218 01/26/2020      Eos                                                                 0.3                                    01/26/2020     Lab Results  Component Value Date   TSH 0.875 01/26/2020       Labs ordered 01/26/2020  :  allergy profile    Assessment   COPD  Group ?  / active smoker  Active smoker - 01/26/2020  After extensive coaching inhaler device,  effectiveness =    50% from a baseline of 10 % > try breztri one bid due to concerns with uacs  - Allergy profile 01/26/2020 >  Eos 0.3 /  IgE    Abrupt onset of refractory symptoms is not typical of copd or asthma but clearly has airway issues and ? Severity copd.   DDX of  difficult airways management almost all start with A and  include Adherence, Ace Inhibitors, Acid Reflux, Active Sinus Disease, Alpha 1 Antitripsin deficiency, Anxiety masquerading as Airways dz,  ABPA,  Allergy(esp in young), Aspiration (esp in elderly), Adverse effects of meds,  Active smoking or vaping, A bunch of PE's (a small clot burden can't cause this syndrome unless there is already severe underlying pulm or vascular dz with poor reserve) plus two Bs  = Bronchiectasis and Beta blocker use..and one C= CHF   Adherence is always the initial "prime suspect" and is a multilayered concern that requires a "trust but verify" approach in every patient - starting with knowing how to use medications, especially inhalers, correctly, keeping up with refills and understanding the  fundamental difference between maintenance and prns vs those medications only taken for a very short course and then stopped and not refilled.  - see hfa teaching -return with all meds in hand using a trust but verify approach to confirm accurate Medication  Reconciliation The principal here is that until we are certain that the  patients are doing what we've asked, it makes no sense to ask them to do more.   ? Acid (or non-acid) GERD > always difficult to exclude as up to 75% of pts in some series report no assoc GI/ Heartburn symptoms> rec max (24h)  acid suppression and diet restrictions/ reviewed and instructions given in writing.   Active smoking also at top of list of usual suspects  (see separate a/p)   ? Adverse drug reaction > none listed, including ACEi > will check pharmacy records  ? Allergy / asthma > just use low dose breztri until sort this out, check IgE   ? ABPA  Check IgE   ? Anxiety > usually at the bottom of this list of usual suspects and may interfere with adherence and also interpretation of response or lack thereof to symptom management which can be quite subjective.   ? A bunch of PE's > neg  Cta at onset despite pos d dimer which is non specific and was already eval with neg venous dopplers as well   ? ChF/ cardiac asthma > bnp nl rules out     DOE (dyspnea on exertion) Did not walk pt today due to clear evidence of upper airway obstruction - will do on return     Upper airway cough syndrome Onset p Kilt exp 07/2019 -  Max gerd rx 01/26/2020   Upper airway cough syndrome (previously labeled PNDS),  is so named because it's frequently impossible to sort out how much is  CR/sinusitis with freq throat clearing (which can be related to primary GERD)   vs  causing  secondary (" extra esophageal")  GERD from wide swings in gastric pressure that occur with throat clearing, often  promoting self use of mint and menthol lozenges that reduce the lower esophageal sphincter  tone and exacerbate the problem further in a cyclical fashion.   These are the same pts (now being labeled as having "irritable larynx syndrome" by some cough centers) who not infrequently have a history of having failed to tolerate ace inhibitors,  dry powder inhalers or biphosphonates or report having atypical/extraesophageal reflux symptoms that don't respond to standard doses of PPI  and are easily confused as having aecopd or asthma flares by even experienced allergists/ pulmonologists (myself included).    >>> rec max rx for gerd then regroup in 4 weeks with ent eval in meantime if not improving      Cigarette smoker Counseled re importance of smoking cessation but did not meet time criteria for separate billing        Each maintenance medication was reviewed in detail including emphasizing most importantly the difference between maintenance and prns and under what circumstances the prns are to be triggered using an action plan format where appropriate.  Total time for H and P, chart review, counseling, teaching device and generating customized AVS unique to this office visit / charting = 64 min         Christinia Gully, MD 01/26/2020

## 2020-01-26 NOTE — Progress Notes (Signed)
Spoke with pt and notified of results per Dr. Wert. Pt verbalized understanding and denied any questions. 

## 2020-01-26 NOTE — Assessment & Plan Note (Signed)
Onset p Kilt exp 07/2019 -  Max gerd rx 01/26/2020   Upper airway cough syndrome (previously labeled PNDS),  is so named because it's frequently impossible to sort out how much is  CR/sinusitis with freq throat clearing (which can be related to primary GERD)   vs  causing  secondary (" extra esophageal")  GERD from wide swings in gastric pressure that occur with throat clearing, often  promoting self use of mint and menthol lozenges that reduce the lower esophageal sphincter tone and exacerbate the problem further in a cyclical fashion.   These are the same pts (now being labeled as having "irritable larynx syndrome" by some cough centers) who not infrequently have a history of having failed to tolerate ace inhibitors,  dry powder inhalers or biphosphonates or report having atypical/extraesophageal reflux symptoms that don't respond to standard doses of PPI  and are easily confused as having aecopd or asthma flares by even experienced allergists/ pulmonologists (myself included).    >>> rec max rx for gerd then regroup in 4 weeks with ent eval in meantime if not improving

## 2020-01-29 LAB — IGE: IgE (Immunoglobulin E), Serum: 186 [IU]/mL (ref 6–495)

## 2020-01-29 NOTE — Progress Notes (Signed)
Called and spoke with patient about lab result and confirmed follow up appointment scheduled later this month. Patient stated she wanted to think about whether or not to be referred to allergy/ent. All questions answered. Nothing further needed at this time.

## 2020-01-31 ENCOUNTER — Other Ambulatory Visit: Payer: Self-pay

## 2020-01-31 DIAGNOSIS — I8390 Asymptomatic varicose veins of unspecified lower extremity: Secondary | ICD-10-CM

## 2020-02-15 ENCOUNTER — Ambulatory Visit (INDEPENDENT_AMBULATORY_CARE_PROVIDER_SITE_OTHER): Payer: Medicare Other | Admitting: Vascular Surgery

## 2020-02-15 ENCOUNTER — Other Ambulatory Visit: Payer: Self-pay

## 2020-02-15 ENCOUNTER — Ambulatory Visit (HOSPITAL_COMMUNITY)
Admission: RE | Admit: 2020-02-15 | Discharge: 2020-02-15 | Disposition: A | Discharge status: Home / Self Care | Payer: Medicare Other | Source: Ambulatory Visit | Attending: Vascular Surgery | Admitting: Vascular Surgery

## 2020-02-15 ENCOUNTER — Encounter: Payer: Self-pay | Admitting: Vascular Surgery

## 2020-02-15 VITALS — BP 141/77 | HR 89 | Temp 97.5°F | Resp 16 | Ht 62.0 in | Wt 198.0 lb

## 2020-02-15 DIAGNOSIS — I890 Lymphedema, not elsewhere classified: Secondary | ICD-10-CM | POA: Diagnosis not present

## 2020-02-15 DIAGNOSIS — I8390 Asymptomatic varicose veins of unspecified lower extremity: Secondary | ICD-10-CM | POA: Insufficient documentation

## 2020-02-15 NOTE — Progress Notes (Signed)
REASON FOR CONSULT:    Bilateral lower extremity edema.  The consult is requested by Dr. Manuella Ghazi.  ASSESSMENT & PLAN:   BILATERAL LOWER EXTREMITY LYMPHEDEMA: This patient has bilateral lower extremity swelling.  Based on her exam I think this is most likely lymphedema.  She has nonpitting edema on exam.  Her noninvasive venous study showed no evidence of significant venous disease.  She had no evidence of DVT, deep venous reflux, or significant superficial venous reflux.  This reason we have discussed conservative treatment for lymphedema.  Most importantly I encouraged her to elevate her legs as much as possible and we have discussed the proper positioning for this.  In addition, I have encouraged her to wear her knee-high compression stockings with a gradient of 15 to 20 mmHg.  I think she would have a hard time getting on stockings that were any tighter.  If she could, then certainly she could wear a tighter stocking.  In addition I encouraged her to avoid prolonged sitting and standing.  Unfortunately she has to sit quite a bit at work.  I encouraged her at work to try to get up and walk around as much as possible.  We also discussed importance of exercise specifically walking and water aerobics.  In addition we discussed the importance of maintaining a healthy weight as central obesity especially increases lower extremity venous pressure and leg swelling.  I do not think any further vascular work-up is indicated at this point.  I also explained that then in the winter it be helpful to keep her skin well lubricated.  I will be happy to see her back in any time if any new vascular issues arise.  Deitra Mayo, MD Office: [phone]   HPI:   Jill Braun is a pleasant 74 y.o. female, who is referred with bilateral lower extremity swelling.  I have reviewed the records from the referring office.  The patient was seen on 11/16/2019 with bilateral lower extremity swelling which had been going on for  about 6 weeks.  Of note she denies any history of congestive heart failure.  She denies any history of chronic kidney disease.  She is had previous appendectomy and hysterectomy but no other abdominal or inguinal surgery or radiation therapy.  I do not get any history of claudication or rest pain.  She does smoke half a pack per day.  Past Medical History:  Diagnosis Date   COPD (chronic obstructive pulmonary disease) ([place]) 06/02/2016   Food poisoning 2019   AT [facility]   GERD (gastroesophageal reflux disease)    Headache    History of cardiac catheterization    Normal coronary arteries 2009   Hyperlipidemia    Middle cerebral artery aneurysm    Left - [facility] 2003   Primary localized osteoarthritis of left knee 03/08/2019    Family History  Problem Relation Age of Onset   Breast cancer Mother    Heart attack Mother    Heart attack Father    Diabetes Mellitus II Father    Colon cancer Neg Hx     SOCIAL HISTORY: Social History   Socioeconomic History   Marital status: Widowed    Spouse name: Not on file   Number of children: Not on file   Years of education: Not on file   Highest education level: Not on file  Occupational History   Not on file  Tobacco Use   Smoking status: Current Some Day Smoker    Packs/day:  2.00    Years: 47.00    Pack years: 94.00    Types: Cigarettes   Smokeless tobacco: Never Used  Scientific laboratory technician Use: Never used  Substance and Sexual Activity   Alcohol use: No   Drug use: No   Sexual activity: Yes  Other Topics Concern   Not on file  Social History Narrative   Not on file   Social Determinants of Health   Financial Resource Strain:    Difficulty of Paying Living Expenses: Not on file  Food Insecurity:    Worried About Charity fundraiser in the Last Year: Not on file   [employer] of Food in the Last Year: Not on file  Transportation Needs:    Lack of Transportation (Medical): Not on file    Lack of Transportation (Non-Medical): Not on file  Physical Activity:    Days of Exercise per Week: Not on file   Minutes of Exercise per Session: Not on file  Stress:    Feeling of Stress : Not on file  Social Connections:    Frequency of Communication with Friends and Family: Not on file   Frequency of Social Gatherings with Friends and Family: Not on file   Attends Religious Services: Not on file   Active Member of Clubs or Organizations: Not on file   Attends Archivist Meetings: Not on file   Marital Status: Not on file  Intimate Partner Violence:    Fear of Current or Ex-Partner: Not on file   Emotionally Abused: Not on file   Physically Abused: Not on file   Sexually Abused: Not on file    No Known Allergies  Current Outpatient Medications  Medication Sig Dispense Refill   acetaminophen (TYLENOL) 500 MG tablet Take 500 mg by mouth every 6 (six) hours as needed for moderate pain or headache.     albuterol (ACCUNEB) 1.25 MG/3ML nebulizer solution Take 1 ampule by nebulization every 6 (six) hours as needed for wheezing.     Albuterol Sulfate 108 (90 Base) MCG/ACT AEPB Inhale 1 puff into the lungs every 6 (six) hours as needed (for shortness of breath/wheezing).      Budeson-Glycopyrrol-Formoterol (BREZTRI AEROSPHERE) 160-9-4.8 MCG/ACT AERO Inhale 2 puffs into the lungs 2 (two) times daily. 5.9 g 0   esomeprazole (NEXIUM) 40 MG capsule Take 40 mg by mouth daily.      furosemide (LASIX) 20 MG tablet Take 20 mg by mouth daily.     loratadine (CLARITIN) 10 MG tablet Take 10 mg by mouth daily.     potassium chloride (K-DUR) 10 MEQ tablet Take 10 mEq by mouth daily as needed (cramps).     temazepam (RESTORIL) 15 MG capsule Take 15 mg by mouth at bedtime as needed for sleep.     No current facility-administered medications for this visit.    REVIEW OF SYSTEMS:  [X]  denotes positive finding, [ ]  denotes negative finding Cardiac  Comments:  Chest  pain or chest pressure:    Shortness of breath upon exertion: x   Short of breath when lying flat: x   Irregular heart rhythm:        Vascular    Pain in calf, thigh, or hip brought on by ambulation: x   Pain in feet at night that wakes you up from your sleep:     Blood clot in your veins:    Leg swelling:  x       Pulmonary  Oxygen at home:    Productive cough:     Wheezing:  x       Neurologic    Sudden weakness in arms or legs:     Sudden numbness in arms or legs:     Sudden onset of difficulty speaking or slurred speech:    Temporary loss of vision in one eye:     Problems with dizziness:         Gastrointestinal    Blood in stool:     Vomited blood:         Genitourinary    Burning when urinating:     Blood in urine:        Psychiatric    Major depression:         Hematologic    Bleeding problems:    Problems with blood clotting too easily:        Skin    Rashes or ulcers:        Constitutional    Fever or chills:     PHYSICAL EXAM:   Vitals:   02/15/20 1345  BP: (!) 141/77  Pulse: 89  Resp: 16  Temp: (!) 97.5 F (36.4 C)  TempSrc: Temporal  SpO2: 98%  Weight: 89.8 kg  Height: 5\' 2"  (1.575 m)    GENERAL: The patient is a well-nourished female, in no acute distress. The vital signs are documented above. CARDIAC: There is a regular rate and rhythm.  VASCULAR: I do not detect carotid bruits. She has palpable dorsalis pedis and posterior tibial pulses bilaterally. She has nonpitting edema bilaterally consistent with lymphedema.  She does not have any significant varicose veins or hyperpigmentation. She has a calf circumference of 44 cm on the right and 44 cm on the left. She has a bruise on her anterior left leg where she bumped it on her car door. PULMONARY: There is good air exchange bilaterally without wheezing or rales. ABDOMEN: Soft and non-tender with normal pitched bowel sounds.  MUSCULOSKELETAL: There are no major deformities or  cyanosis. NEUROLOGIC: No focal weakness or paresthesias are detected. SKIN: There are no ulcers or rashes noted. PSYCHIATRIC: The patient has a normal affect.  DATA:    VENOUS DUPLEX: I have independently interpreted her venous duplex scan today.  On the right side, there is no evidence of DVT or superficial venous thrombosis.  There is no significant deep venous reflux.  There is no significant superficial venous reflux.  On the left side there is no evidence of DVT or superficial venous thrombosis.  There is no deep venous reflux.  There is no superficial venous reflux.  LABS: I reviewed her labs from 08/07/2019 which showed a normal GFR greater than 60.

## 2020-02-22 ENCOUNTER — Ambulatory Visit: Payer: Medicare Other | Admitting: Internal Medicine

## 2020-03-12 DIAGNOSIS — M1712 Unilateral primary osteoarthritis, left knee: Secondary | ICD-10-CM | POA: Diagnosis not present

## 2020-04-30 DIAGNOSIS — F17210 Nicotine dependence, cigarettes, uncomplicated: Secondary | ICD-10-CM | POA: Diagnosis not present

## 2020-04-30 DIAGNOSIS — Z23 Encounter for immunization: Secondary | ICD-10-CM | POA: Diagnosis not present

## 2020-04-30 DIAGNOSIS — Z299 Encounter for prophylactic measures, unspecified: Secondary | ICD-10-CM | POA: Diagnosis not present

## 2020-04-30 DIAGNOSIS — N390 Urinary tract infection, site not specified: Secondary | ICD-10-CM | POA: Diagnosis not present

## 2020-04-30 DIAGNOSIS — J441 Chronic obstructive pulmonary disease with (acute) exacerbation: Secondary | ICD-10-CM | POA: Diagnosis not present

## 2020-04-30 DIAGNOSIS — J449 Chronic obstructive pulmonary disease, unspecified: Secondary | ICD-10-CM | POA: Diagnosis not present

## 2020-06-04 DIAGNOSIS — M1712 Unilateral primary osteoarthritis, left knee: Secondary | ICD-10-CM | POA: Diagnosis not present

## 2020-06-25 ENCOUNTER — Encounter (INDEPENDENT_AMBULATORY_CARE_PROVIDER_SITE_OTHER): Payer: Self-pay | Admitting: *Deleted

## 2020-08-03 IMAGING — DX DG CHEST 1V PORT
1 series · 1 of 1 positions shown · non-contrast
Comparison: Chest radiograph dated 03/13/2019.

CLINICAL DATA: 73-year-old female with shortness of breath and
chest pain.

EXAM:
PORTABLE CHEST 1 VIEW

[chest ap]
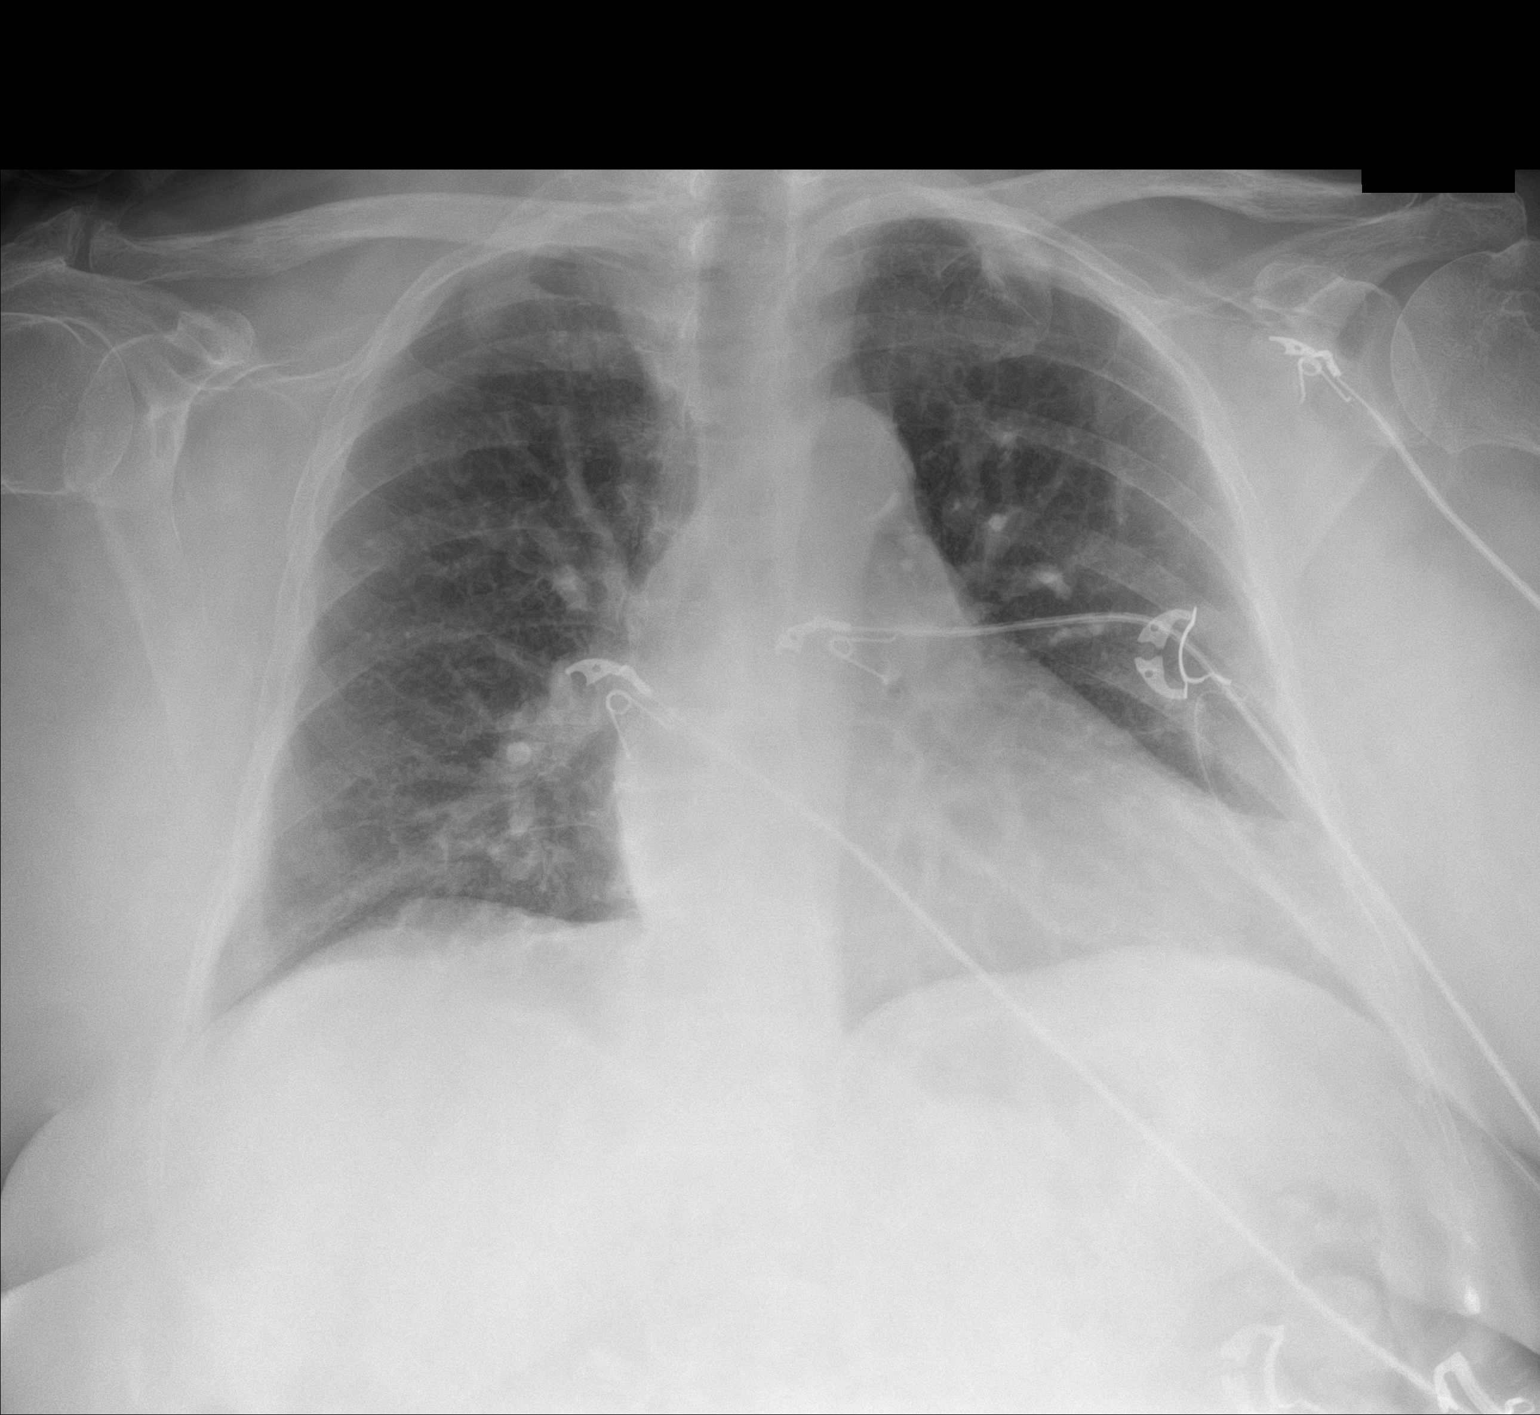

[1 of 1 positions shown; findings below may reference images not displayed]

FINDINGS: There is mild cardiomegaly and vascular congestion. No edema. No
focal consolidation, pleural effusion, or pneumothorax.
Atherosclerotic calcification of the aorta. No acute osseous
pathology.
IMPRESSION: Mild cardiomegaly and vascular congestion. No focal consolidation or
edema.

## 2020-08-03 IMAGING — CT CT ANGIO CHEST
2 of 6 series · 19 of 46 positions shown · IV contrast (Omnipaque or Isovue)
Comparison: 07/21/2017, 08/07/2019

CLINICAL DATA: Chest pain and shortness of breath for 5 days,
elevated D-dimer

EXAM:
CT ANGIOGRAPHY CHEST WITH CONTRAST
TECHNIQUE: Multidetector CT imaging of the chest was performed using the
standard protocol during bolus administration of intravenous
contrast. Multiplanar CT image reconstructions and MIPs were
obtained to evaluate the vascular anatomy.
CONTRAST:  100mL OMNIPAQUE IOHEXOL 350 MG/ML SOLN

[Series 6: pe axial thins · axial · 0.61mm/px · z∈[+1373,+1590]mm · 16 of 239 slices shown]
[im 11/239  lung]
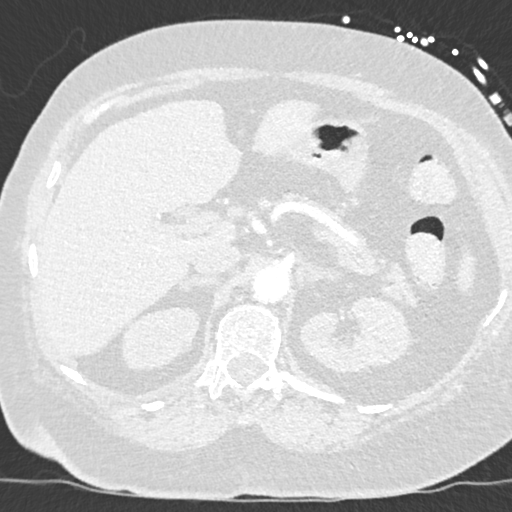
[im 32/239  soft-tissue]
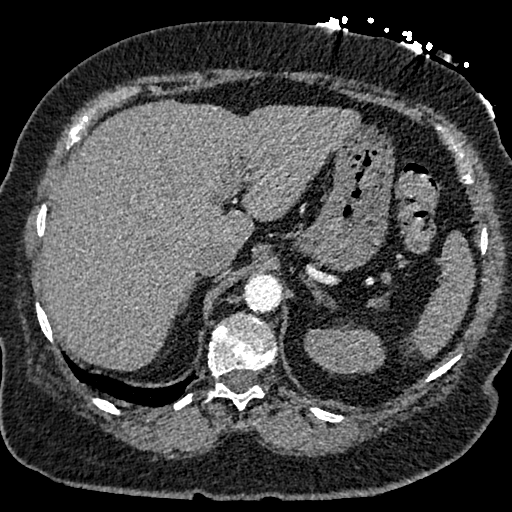
[im 42/239  lung]
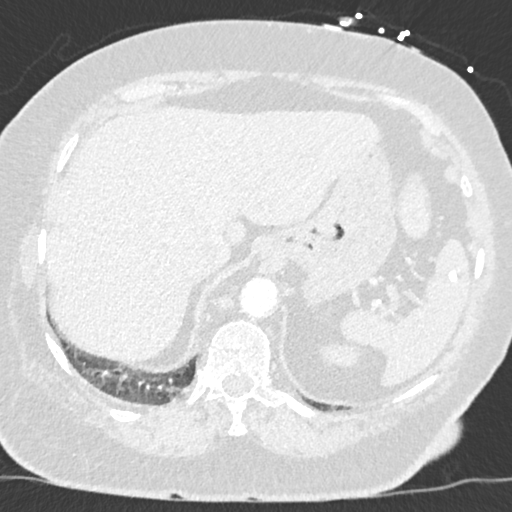
[im 52/239  soft-tissue]
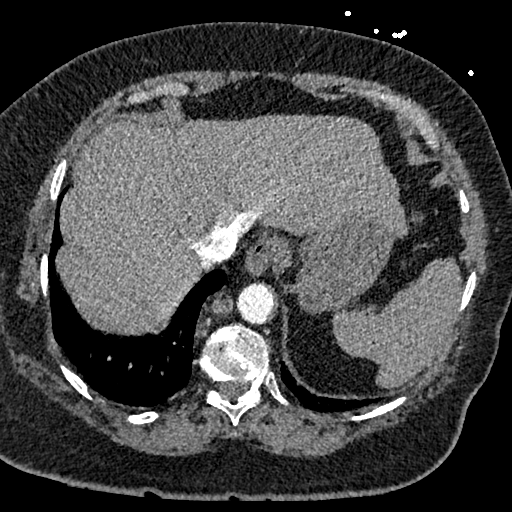
[im 73/239  lung]
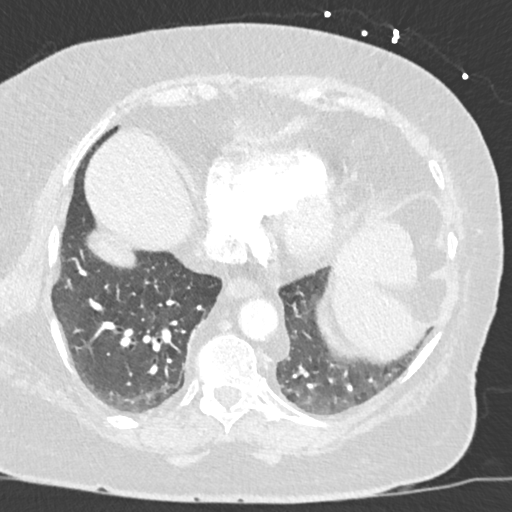
[im 83/239  soft-tissue]
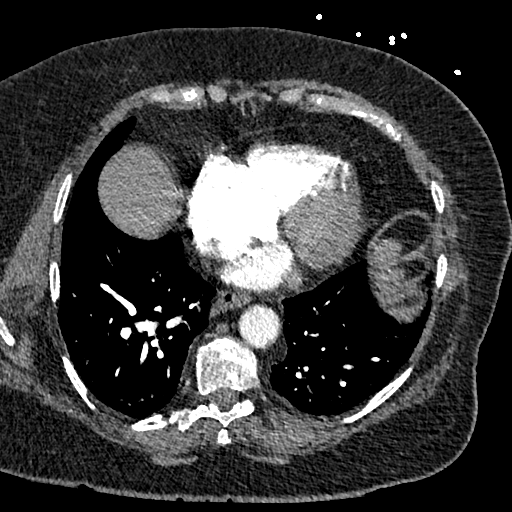
[im 94/239  lung]
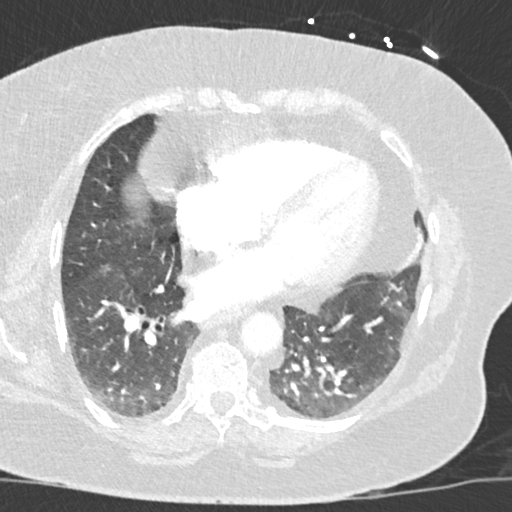
[im 114/239  soft-tissue]
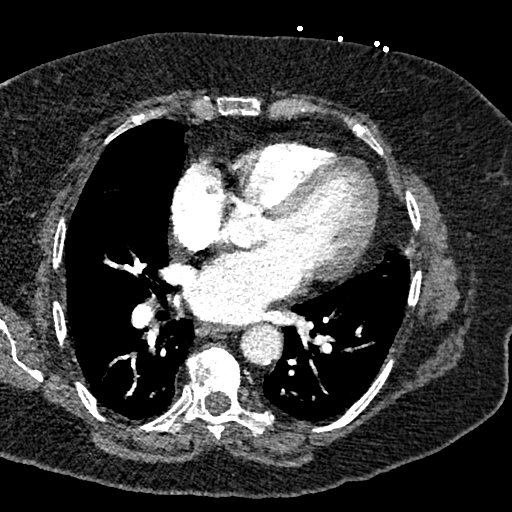
[im 125/239  lung]
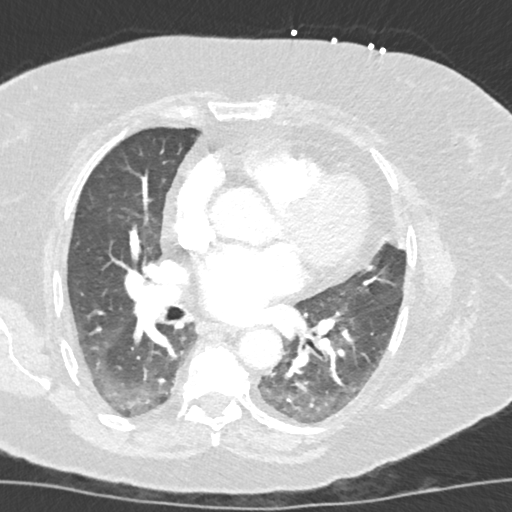
[im 145/239  soft-tissue]
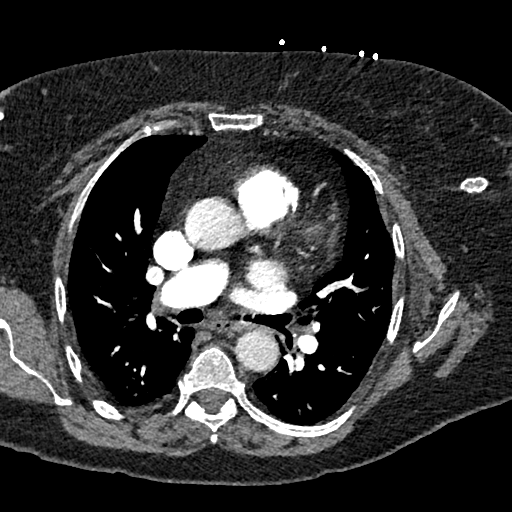
[im 156/239  lung]
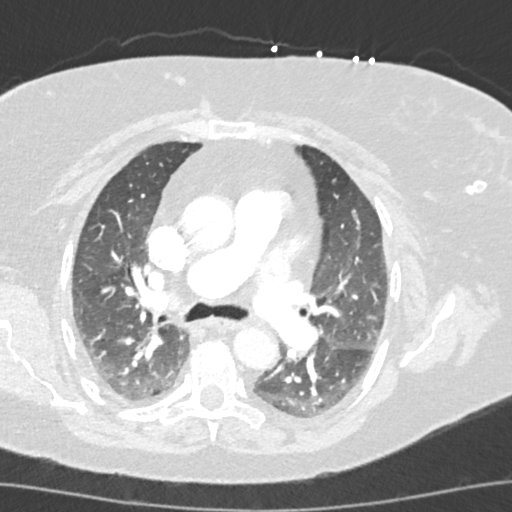
[im 166/239  soft-tissue]
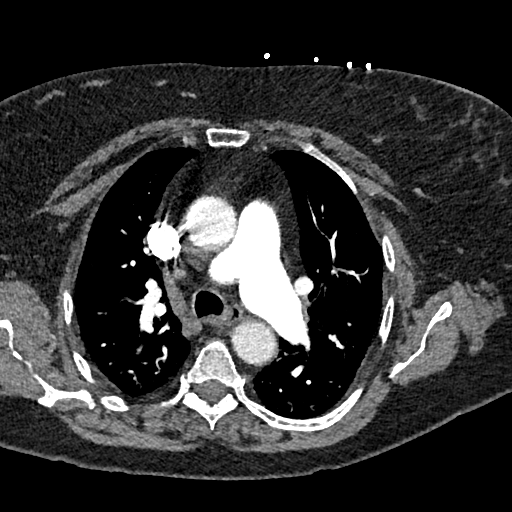
[im 187/239  lung]
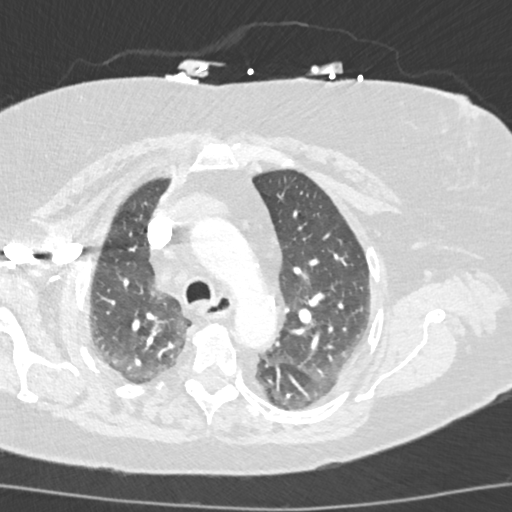
[im 197/239  soft-tissue]
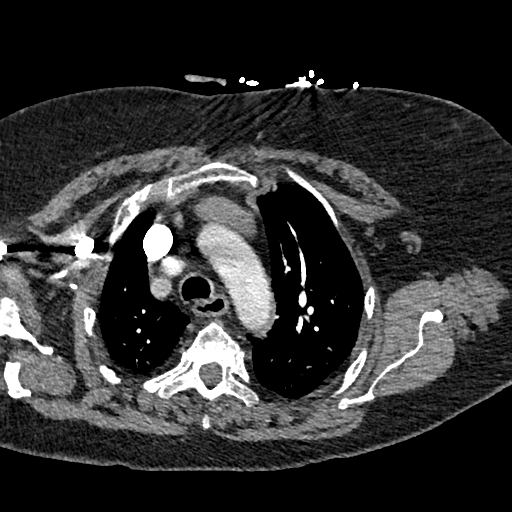
[im 207/239  lung]
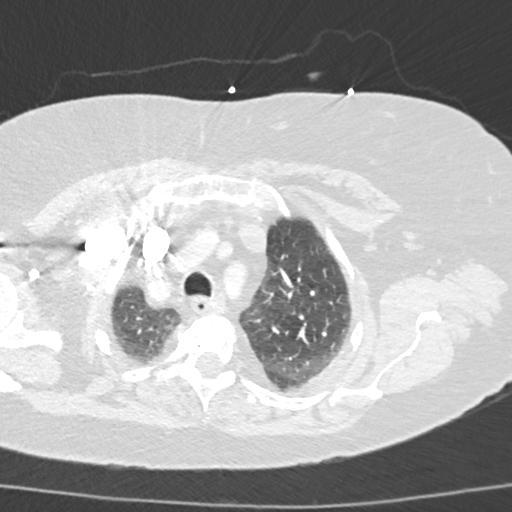
[im 228/239  soft-tissue]
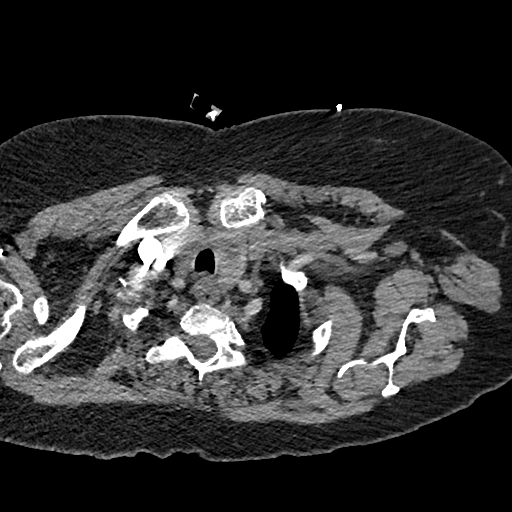

[Series 9: cor soft · coronal · 0.50mm/px · 3 of 151 slices shown]
[im 38/151  soft-tissue]
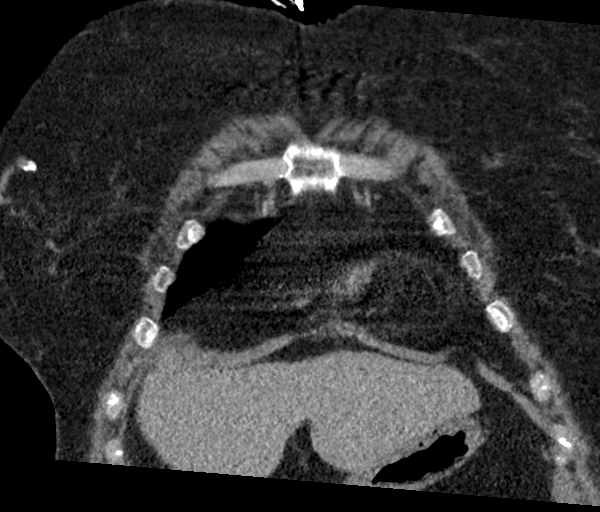
[im 76/151  soft-tissue]
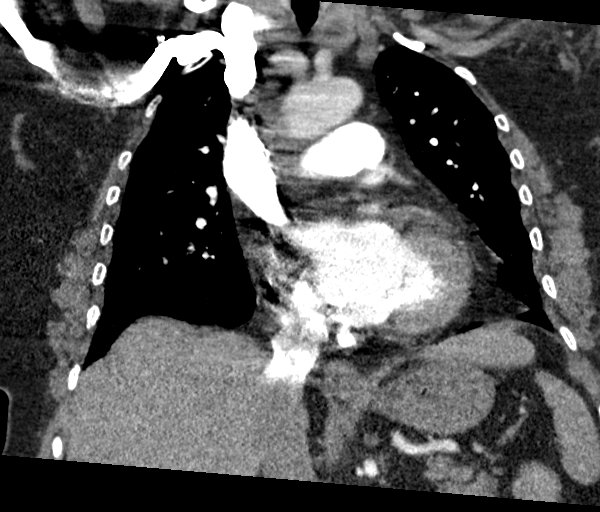
[im 113/151  soft-tissue]
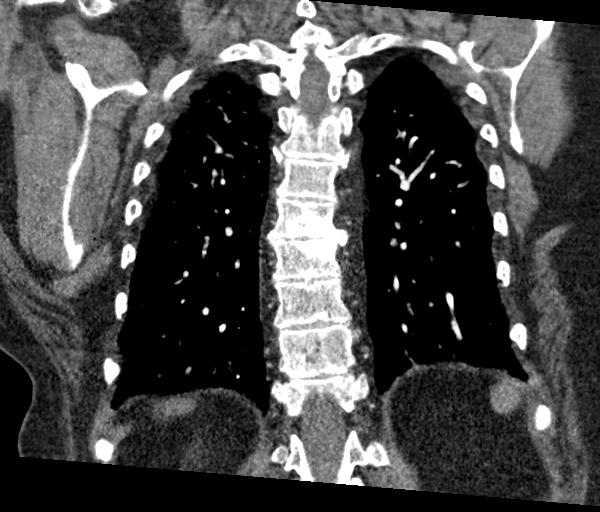

[19 of 46 positions shown; findings below may reference images not displayed]

FINDINGS: Cardiovascular: This is a technically adequate evaluation of the
pulmonary vasculature. There are no filling defects or pulmonary
emboli. Heart is unremarkable without pericardial effusion. No
evidence of thoracic aortic aneurysm or dissection. Minimal
atherosclerosis.

Mediastinum/Nodes: Stable cyst left lobe thyroid. No pathologic
mediastinal or hilar adenopathy.

Lungs/Pleura: No acute airspace disease, effusion, or pneumothorax.
The central airways are widely patent.

Upper Abdomen: Limited imaging through the upper abdomen is
unremarkable.

Musculoskeletal: There are no acute displaced fractures.
Reconstructed images demonstrate stable midthoracic spondylosis.

Review of the MIP images confirms the above findings.
IMPRESSION: 1. No evidence of pulmonary embolus.
2. No acute intrathoracic process.
3.  Aortic Atherosclerosis ([id number]).

## 2020-08-06 ENCOUNTER — Telehealth (INDEPENDENT_AMBULATORY_CARE_PROVIDER_SITE_OTHER): Payer: Self-pay

## 2020-08-06 ENCOUNTER — Encounter (INDEPENDENT_AMBULATORY_CARE_PROVIDER_SITE_OTHER): Payer: Self-pay

## 2020-08-06 ENCOUNTER — Other Ambulatory Visit (INDEPENDENT_AMBULATORY_CARE_PROVIDER_SITE_OTHER): Payer: Self-pay

## 2020-08-06 DIAGNOSIS — Z1211 Encounter for screening for malignant neoplasm of colon: Secondary | ICD-10-CM

## 2020-08-06 DIAGNOSIS — Z86010 Personal history of colonic polyps: Secondary | ICD-10-CM

## 2020-08-06 MED ORDER — NA SULFATE-K SULFATE-MG SULF 17.5-3.13-1.6 GM/177ML PO SOLN: 354.0000 mL | Freq: Once | ORAL | 0 refills | Status: AC

## 2020-08-06 NOTE — Telephone Encounter (Signed)
Referring MD/PCP: vyas   Procedure: tcs  Reason/Indication:  Hx of colon polyps  Has patient had this procedure before?  yes  If so, when, by whom and where?  2019  Is there a family history of colon cancer?  no  Who?  What age when diagnosed?    Is patient diabetic?   no      Does patient have prosthetic heart valve or mechanical valve?  no  Do you have a pacemaker/defibrillator?  no  Has patient ever had endocarditis/atrial fibrillation? no  Have you had a stroke/heart attack last 6 mths? no  Does patient use oxygen? no  Has patient had joint replacement within last 12 months?  no  Is patient constipated or do they take laxatives? no  Does patient have a history of alcohol/drug use?  no  Is patient on blood thinner such as Coumadin, Plavix and/or Aspirin? no  Medications: esomeprazole 40mg  daily  Allergies: nkda  Procedure date & time: 08/29/20 8:15

## 2020-08-06 NOTE — Telephone Encounter (Signed)
LeighAnn Milos Milligan, CMA  

## 2020-08-27 ENCOUNTER — Other Ambulatory Visit (HOSPITAL_COMMUNITY)
Admission: RE | Admit: 2020-08-27 | Discharge: 2020-08-27 | Disposition: A | Discharge status: Home / Self Care | Payer: Medicare Other | Source: Ambulatory Visit | Attending: Internal Medicine | Admitting: Internal Medicine

## 2020-08-27 ENCOUNTER — Other Ambulatory Visit: Payer: Self-pay

## 2020-08-27 DIAGNOSIS — Z01812 Encounter for preprocedural laboratory examination: Secondary | ICD-10-CM | POA: Insufficient documentation

## 2020-08-27 DIAGNOSIS — Z20822 Contact with and (suspected) exposure to covid-19: Secondary | ICD-10-CM | POA: Insufficient documentation

## 2020-08-27 LAB — SARS CORONAVIRUS 2 (TAT 6-24 HRS): SARS Coronavirus 2: NEGATIVE

## 2020-08-29 ENCOUNTER — Encounter (HOSPITAL_COMMUNITY): Payer: Self-pay | Admitting: Internal Medicine

## 2020-08-29 ENCOUNTER — Encounter (HOSPITAL_COMMUNITY)
Admission: RE | Disposition: A | Discharge status: Home / Self Care | Payer: Self-pay | Source: Home / Self Care | Attending: Internal Medicine

## 2020-08-29 ENCOUNTER — Ambulatory Visit (HOSPITAL_COMMUNITY)
Admission: RE | Admit: 2020-08-29 | Discharge: 2020-08-29 | Disposition: A | Discharge status: Home / Self Care | Payer: Medicare Other | Attending: Internal Medicine | Admitting: Internal Medicine

## 2020-08-29 ENCOUNTER — Other Ambulatory Visit: Payer: Self-pay

## 2020-08-29 DIAGNOSIS — D120 Benign neoplasm of cecum: Secondary | ICD-10-CM | POA: Insufficient documentation

## 2020-08-29 DIAGNOSIS — K6289 Other specified diseases of anus and rectum: Secondary | ICD-10-CM | POA: Diagnosis not present

## 2020-08-29 DIAGNOSIS — Z86010 Personal history of colonic polyps: Secondary | ICD-10-CM | POA: Insufficient documentation

## 2020-08-29 DIAGNOSIS — F17210 Nicotine dependence, cigarettes, uncomplicated: Secondary | ICD-10-CM | POA: Insufficient documentation

## 2020-08-29 DIAGNOSIS — Z833 Family history of diabetes mellitus: Secondary | ICD-10-CM | POA: Insufficient documentation

## 2020-08-29 DIAGNOSIS — Z8249 Family history of ischemic heart disease and other diseases of the circulatory system: Secondary | ICD-10-CM | POA: Diagnosis not present

## 2020-08-29 DIAGNOSIS — Z803 Family history of malignant neoplasm of breast: Secondary | ICD-10-CM | POA: Insufficient documentation

## 2020-08-29 DIAGNOSIS — D123 Benign neoplasm of transverse colon: Secondary | ICD-10-CM | POA: Insufficient documentation

## 2020-08-29 DIAGNOSIS — Z79899 Other long term (current) drug therapy: Secondary | ICD-10-CM | POA: Diagnosis not present

## 2020-08-29 DIAGNOSIS — K5730 Diverticulosis of large intestine without perforation or abscess without bleeding: Secondary | ICD-10-CM | POA: Diagnosis not present

## 2020-08-29 DIAGNOSIS — J449 Chronic obstructive pulmonary disease, unspecified: Secondary | ICD-10-CM | POA: Insufficient documentation

## 2020-08-29 DIAGNOSIS — D125 Benign neoplasm of sigmoid colon: Secondary | ICD-10-CM | POA: Diagnosis not present

## 2020-08-29 DIAGNOSIS — K635 Polyp of colon: Secondary | ICD-10-CM | POA: Insufficient documentation

## 2020-08-29 DIAGNOSIS — Z1211 Encounter for screening for malignant neoplasm of colon: Secondary | ICD-10-CM | POA: Diagnosis not present

## 2020-08-29 DIAGNOSIS — K621 Rectal polyp: Secondary | ICD-10-CM | POA: Insufficient documentation

## 2020-08-29 DIAGNOSIS — K648 Other hemorrhoids: Secondary | ICD-10-CM | POA: Diagnosis not present

## 2020-08-29 DIAGNOSIS — K219 Gastro-esophageal reflux disease without esophagitis: Secondary | ICD-10-CM | POA: Insufficient documentation

## 2020-08-29 DIAGNOSIS — K644 Residual hemorrhoidal skin tags: Secondary | ICD-10-CM | POA: Insufficient documentation

## 2020-08-29 DIAGNOSIS — Z09 Encounter for follow-up examination after completed treatment for conditions other than malignant neoplasm: Secondary | ICD-10-CM | POA: Diagnosis not present

## 2020-08-29 DIAGNOSIS — Z7951 Long term (current) use of inhaled steroids: Secondary | ICD-10-CM | POA: Insufficient documentation

## 2020-08-29 HISTORY — PX: POLYPECTOMY: SHX5525

## 2020-08-29 HISTORY — PX: COLONOSCOPY: SHX5424

## 2020-08-29 LAB — HM COLONOSCOPY

## 2020-08-29 SURGERY — COLONOSCOPY
Anesthesia: Moderate Sedation

## 2020-08-29 MED ORDER — SODIUM CHLORIDE 0.9 % IV SOLN: INTRAVENOUS | Status: DC

## 2020-08-29 MED ORDER — MIDAZOLAM HCL 5 MG/5ML IJ SOLN
INTRAMUSCULAR | Status: DC | PRN
  Administered 2020-08-29 (×4): 1 mg via INTRAVENOUS
  Administered 2020-08-29 (×2): 2 mg via INTRAVENOUS

## 2020-08-29 MED ORDER — MIDAZOLAM HCL 5 MG/5ML IJ SOLN
INTRAMUSCULAR | Status: AC
  Filled 2020-08-29: qty 10

## 2020-08-29 MED ORDER — MEPERIDINE HCL 50 MG/ML IJ SOLN
INTRAMUSCULAR | Status: DC | PRN
  Administered 2020-08-29 (×2): 25 mg via INTRAVENOUS

## 2020-08-29 MED ORDER — STERILE WATER FOR IRRIGATION IR SOLN
Status: DC | PRN
  Administered 2020-08-29: 1.5 mL

## 2020-08-29 MED ORDER — MEPERIDINE HCL 50 MG/ML IJ SOLN
INTRAMUSCULAR | Status: AC
  Filled 2020-08-29: qty 1

## 2020-08-29 NOTE — H&P (Signed)
Jill Braun is an 75 y.o. female.   Chief Complaint: Jill Braun is here for colonoscopy HPI: Jill Braun is 75 year old Caucasian female who is here for surveillance colonoscopy.  Last colonoscopy was in January 2019 with removal of 10 polyps.  All of these polyps are tubular adenomas but one polyp was sessile serrated polyp.  Jill Braun was advised to return in 3 years.  Jill Braun also had random biopsies at the time of her last examination and Jill Braun did not have microscopic colitis.  Jill Braun says Jill Braun has intermittent diarrhea for which Jill Braun takes Imodium on as-needed basis.  Jill Braun denies rectal bleeding.  Jill Braun has good appetite.  Jill Braun says Jill Braun has gained some weight.  Jill Braun says Jill Braun uses albuterol inhaler on as-needed basis.  Jill Braun is try to quit cigarette smoking and now smoking less than a pack per week. Family history is negative for CRC.  Past Medical History:  Diagnosis Date  . COPD (chronic obstructive pulmonary disease) ([place]) 06/02/2016  . Food poisoning 2019   AT [facility]  . GERD (gastroesophageal reflux disease)   . Headache   . History of cardiac catheterization    Normal coronary arteries 2009  . Hyperlipidemia   . Middle cerebral artery aneurysm    Left - [facility] 2003  . Primary localized osteoarthritis of left knee 03/08/2019    Past Surgical History:  Procedure Laterality Date  . ABDOMINAL HYSTERECTOMY     1 OVARY LEFT  . BIOPSY  07/02/2017   Procedure: BIOPSY;  Surgeon: Rogene Houston, MD;  Location: [facility];  Service: Endoscopy;;  colon  . BREAST SURGERY  1993   REDUCTION   . CATARACT EXTRACTION W/PHACO Right 02/01/2014   Procedure: CATARACT EXTRACTION PHACO AND INTRAOCULAR LENS PLACEMENT (IOC);  Surgeon: Tonny Branch, MD;  Location: [facility] ORS;  Service: Ophthalmology;  Laterality: Right;  CDE 7.59  . CATARACT EXTRACTION W/PHACO Left 02/22/2014   Procedure: CATARACT EXTRACTION PHACO AND INTRAOCULAR LENS PLACEMENT (IOC);  Surgeon: Tonny Branch, MD;  Location: [facility];  Service: Ophthalmology;   Laterality: Left;  CDE 8.12  . Cerebral aneurysm surgery     2003 at [place]  . COLONOSCOPY N/A 07/02/2017   Procedure: COLONOSCOPY;  Surgeon: Rogene Houston, MD;  Location: [facility];  Service: Endoscopy;  Laterality: N/A;  7:30  . POLYPECTOMY  07/02/2017   Procedure: POLYPECTOMY;  Surgeon: Rogene Houston, MD;  Location: [facility];  Service: Endoscopy;;  colon  . TOTAL KNEE ARTHROPLASTY Left 03/20/2019   Procedure: TOTAL KNEE ARTHROPLASTY;  Surgeon: Elsie Saas, MD;  Location: [facility];  Service: Orthopedics;  Laterality: Left;    Family History  Problem Relation Age of Onset  . Breast cancer Mother   . Heart attack Mother   . Heart attack Father   . Diabetes Mellitus II Father   . Colon cancer Neg Hx    Social History:  reports that Jill Braun has been smoking cigarettes. Jill Braun has a 94.00 pack-year smoking history. Jill Braun has never used smokeless tobacco. Jill Braun reports that Jill Braun does not drink alcohol and does not use drugs.  Allergies: No Known Allergies  Medications Prior to Admission  Medication Sig Dispense Refill  . acetaminophen (TYLENOL) 500 MG tablet Take 500 mg by mouth every 6 (six) hours as needed for moderate pain or headache.    . albuterol (ACCUNEB) 1.25 MG/3ML nebulizer solution Take 1 ampule by nebulization every 6 (six) hours as needed for wheezing.    Marland Kitchen  Albuterol Sulfate 108 (90 Base) MCG/ACT AEPB Inhale 1 puff into the lungs every 6 (six) hours as needed (for shortness of breath/wheezing).     Marland Kitchen esomeprazole (NEXIUM) 40 MG capsule Take 40 mg by mouth daily.     . Fluticasone-Umeclidin-Vilant (TRELEGY ELLIPTA) 100-62.5-25 MCG/INH AEPB Inhale 1 puff into the lungs daily as needed (shortness of breath).    . Budeson-Glycopyrrol-Formoterol (BREZTRI AEROSPHERE) 160-9-4.8 MCG/ACT AERO Inhale 2 puffs into the lungs 2 (two) times daily. (Jill Braun not taking: No sig reported) 5.9 g 0  . furosemide (LASIX) 20 MG tablet Take 20 mg by mouth daily. (Jill Braun  not taking: No sig reported)    . loratadine (CLARITIN) 10 MG tablet Take 10 mg by mouth daily. (Jill Braun not taking: No sig reported)    . potassium chloride (K-DUR) 10 MEQ tablet Take 10 mEq by mouth daily as needed (cramps). (Jill Braun not taking: Reported on 08/20/2020)    . temazepam (RESTORIL) 15 MG capsule Take 15 mg by mouth at bedtime as needed for sleep. (Jill Braun not taking: No sig reported)      Results for orders placed or performed during the hospital encounter of 08/27/20 (from the past 48 hour(s))  SARS CORONAVIRUS 2 (TAT 6-24 HRS) Nasopharyngeal Nasopharyngeal Swab     Status: None   Collection Time: 08/27/20  9:05 AM   Specimen: Nasopharyngeal Swab  Result Value Ref Range   SARS Coronavirus 2 NEGATIVE NEGATIVE    Comment: (NOTE) SARS-CoV-2 target nucleic acids are NOT DETECTED.  The SARS-CoV-2 RNA is generally detectable in upper and lower respiratory specimens during the acute phase of infection. Negative results do not preclude SARS-CoV-2 infection, do not rule out co-infections with other pathogens, and should not be used as the sole basis for treatment or other Jill Braun management decisions. Negative results must be combined with clinical observations, Jill Braun history, and epidemiological information. The expected result is Negative.  Fact Sheet for Patients: SugarRoll.be  Fact Sheet for Healthcare Providers: https://www.woods-mathews.com/  This test is not yet approved or cleared by the [place] FDA and  has been authorized for detection and/or diagnosis of SARS-CoV-2 by FDA under an Emergency Use Authorization (EUA). This EUA will remain  in effect (meaning this test can be used) for the duration of the COVID-19 declaration under Se ction 564(b)(1) of the Act, 21 U.S.C. section 360bbb-3(b)(1), unless the authorization is terminated or revoked sooner.  Performed at [facility], [place] [address] [place],  [place] [address]    No results found.  Review of Systems  Blood pressure 117/71, pulse 97, temperature 97.6 F (36.4 C), temperature source Oral, resp. rate (!) 22, height 5' 1.5" (1.562 m), SpO2 93 %. Physical Exam HENT:     Mouth/Throat:     Mouth: Mucous membranes are moist.     Pharynx: Oropharynx is clear.  Eyes:     General: No scleral icterus.    Conjunctiva/sclera: Conjunctivae normal.  Cardiovascular:     Rate and Rhythm: Normal rate and regular rhythm.     Heart sounds: Normal heart sounds. No murmur heard.   Pulmonary:     Comments: Scattered expiratory rhonchi noted bilaterally. Abdominal:     Comments: Abdomen is full with upper and lower midline scars.  On palpation is soft and nontender with organomegaly or masses  Musculoskeletal:        General: No swelling.     Cervical back: Neck supple.  Lymphadenopathy:     Cervical: No cervical adenopathy.  Skin:  General: Skin is warm and dry.  Neurological:     Mental Status: Jill Braun is alert.      Assessment/Plan  History of multiple colonic polyps Surveillance colonoscopy.  Hildred Laser, MD 08/29/2020, 8:41 AM

## 2020-08-29 NOTE — Discharge Instructions (Signed)
Colonoscopy, Adult, Care After This sheet gives you information about how to care for yourself after your procedure. Your doctor may also give you more specific instructions. If you have problems or questions, call your doctor. What can I expect after the procedure? After the procedure, it is common to have:  A small amount of blood in your poop (stool) for 24 hours.  Some gas.  Mild cramping or bloating in your belly (abdomen). Follow these instructions at home: Eating and drinking  Drink enough fluid to keep your pee (urine) pale yellow.  Follow instructions from your doctor about what you cannot eat or drink.  Return to your normal diet as told by your doctor. Avoid heavy or fried foods that are hard to digest.   Activity  Rest as told by your doctor.  Do not sit for a long time without moving. Get up to take short walks every 1-2 hours. This is important. Ask for help if you feel weak or unsteady.  Return to your normal activities as told by your doctor. Ask your doctor what activities are safe for you. To help cramping and bloating:  Try walking around.  Put heat on your belly as told by your doctor. Use the heat source that your doctor recommends, such as a moist heat pack or a heating pad. ? Put a towel between your skin and the heat source. ? Leave the heat on for 20-30 minutes. ? Remove the heat if your skin turns bright red. This is very important if you are unable to feel pain, heat, or cold. You may have a greater risk of getting burned.   General instructions  If you were given a medicine to help you relax (sedative) during your procedure, it can affect you for many hours. Do not drive or use machinery until your doctor says that it is safe.  For the first 24 hours after the procedure: ? Do not sign important documents. ? Do not drink alcohol. ? Do your daily activities more slowly than normal. ? Eat foods that are soft and easy to digest.  Take  over-the-counter or prescription medicines only as told by your doctor.  Keep all follow-up visits as told by your doctor. This is important. Contact a doctor if:  You have blood in your poop 2-3 days after the procedure. Get help right away if:  You have more than a small amount of blood in your poop.  You see large clumps of tissue (blood clots) in your poop.  Your belly is swollen.  You feel like you may vomit (nauseous).  You vomit.  You have a fever.  You have belly pain that gets worse, and medicine does not help your pain. Summary  After the procedure, it is common to have a small amount of blood in your poop. You may also have mild cramping and bloating in your belly.  If you were given a medicine to help you relax (sedative) during your procedure, it can affect you for many hours. Do not drive or use machinery until your doctor says that it is safe.  Get help right away if you have a lot of blood in your poop, feel like you may vomit, have a fever, or have more belly pain. This information is not intended to replace advice given to you by your health care provider. Make sure you discuss any questions you have with your health care provider. Document Revised: 04/21/2019 Document Reviewed: 01/09/2019 Elsevier Patient Education  2021  Elsevier Inc.   High-Fiber Eating Plan Fiber, also called dietary fiber, is a type of carbohydrate. It is found foods such as fruits, vegetables, whole grains, and beans. A high-fiber diet can have many health benefits. Your health care provider may recommend a high-fiber diet to help:  Prevent constipation. Fiber can make your bowel movements more regular.  Lower your cholesterol.  Relieve the following conditions: ? Inflammation of veins in the anus (hemorrhoids). ? Inflammation of specific areas of the digestive tract (uncomplicated diverticulosis). ? A problem of the large intestine, also called the colon, that sometimes causes pain  and diarrhea (irritable bowel syndrome, or IBS).  Prevent overeating as part of a weight-loss plan.  Prevent heart disease, type 2 diabetes, and certain cancers. What are tips for following this plan? Reading food labels  Check the nutrition facts label on food products for the amount of dietary fiber. Choose foods that have 5 grams of fiber or more per serving.  The goals for recommended daily fiber intake include: ? Men (age 69 or younger): 34-38 g. ? Men (over age 79): 28-34 g. ? Women (age 5 or younger): 25-28 g. ? Women (over age 35): 22-25 g. Your daily fiber goal is _____________ g.   Shopping  Choose whole fruits and vegetables instead of processed forms, such as apple juice or applesauce.  Choose a wide variety of high-fiber foods such as avocados, lentils, oats, and kidney beans.  Read the nutrition facts label of the foods you choose. Be aware of foods with added fiber. These foods often have high sugar and sodium amounts per serving. Cooking  Use whole-grain flour for baking and cooking.  Cook with [place] rice instead of white rice. Meal planning  Start the day with a breakfast that is high in fiber, such as a cereal that contains 5 g of fiber or more per serving.  Eat breads and cereals that are made with whole-grain flour instead of refined flour or white flour.  Eat [place] rice, bulgur wheat, or millet instead of white rice.  Use beans in place of meat in soups, salads, and pasta dishes.  Be sure that half of the grains you eat each day are whole grains. General information  You can get the recommended daily intake of dietary fiber by: ? Eating a variety of fruits, vegetables, grains, nuts, and beans. ? Taking a fiber supplement if you are not able to take in enough fiber in your diet. It is better to get fiber through food than from a supplement.  Gradually increase how much fiber you consume. If you increase your intake of dietary fiber too quickly, you  may have bloating, cramping, or gas.  Drink plenty of water to help you digest fiber.  Choose high-fiber snacks, such as berries, raw vegetables, nuts, and popcorn. What foods should I eat? Fruits Berries. Pears. Apples. Oranges. Avocado. Prunes and raisins. Dried figs. Vegetables Sweet potatoes. Spinach. Kale. Artichokes. Cabbage. Broccoli. Cauliflower. Green peas. Carrots. Squash. Grains Whole-grain breads. Multigrain cereal. Oats and oatmeal. [place] rice. Barley. Bulgur wheat. [place]. Quinoa. Bran muffins. Popcorn. Rye wafer crackers. Meats and other proteins Navy beans, kidney beans, and pinto beans. Soybeans. Split peas. Lentils. Nuts and seeds. Dairy Fiber-fortified yogurt. Beverages Fiber-fortified soy milk. Fiber-fortified orange juice. Other foods Fiber bars. The items listed above may not be a complete list of recommended foods and beverages. Contact a dietitian for more information. What foods should I avoid? Fruits Fruit juice. Cooked, strained fruit. Vegetables Fried potatoes.  Canned vegetables. Well-cooked vegetables. Grains White bread. Pasta made with refined flour. White rice. Meats and other proteins Fatty cuts of meat. Fried chicken or fried fish. Dairy Milk. Yogurt. Cream cheese. Sour cream. Fats and oils Butters. Beverages Soft drinks. Other foods Cakes and pastries. The items listed above may not be a complete list of foods and beverages to avoid. Talk with your dietitian about what choices are best for you. Summary  Fiber is a type of carbohydrate. It is found in foods such as fruits, vegetables, whole grains, and beans.  A high-fiber diet has many benefits. It can help to prevent constipation, lower blood cholesterol, aid weight loss, and reduce your risk of heart disease, diabetes, and certain cancers.  Increase your intake of fiber gradually. Increasing fiber too quickly may cause cramping, bloating, and gas. Drink plenty of water while you  increase the amount of fiber you consume.  The best sources of fiber include whole fruits and vegetables, whole grains, nuts, seeds, and beans. This information is not intended to replace advice given to you by your health care provider. Make sure you discuss any questions you have with your health care provider. Document Revised: 10/19/2019 Document Reviewed: 10/19/2019 Elsevier Patient Education  2021 [place].  Colon Polyps  Colon polyps are tissue growths inside the colon, which is part of the large intestine. They are one of the types of polyps that can grow in the body. A polyp may be a round bump or a mushroom-shaped growth. You could have one polyp or more than one. Most colon polyps are noncancerous (benign). However, some colon polyps can become cancerous over time. Finding and removing the polyps early can help prevent this. What are the causes? The exact cause of colon polyps is not known. What increases the risk? The following factors may make you more likely to develop this condition:  Having a family history of colorectal cancer or colon polyps.  Being older than 76 years of age.  Being younger than 76 years of age and having a significant family history of colorectal cancer or colon polyps or a genetic condition that puts you at higher risk of getting colon polyps.  Having inflammatory bowel disease, such as ulcerative colitis or Crohn's disease.  Having certain conditions passed from parent to child (hereditary conditions), such as: ? Familial adenomatous polyposis (FAP). ? Lynch syndrome. ? Turcot syndrome. ? Peutz-Jeghers syndrome. ? MUTYH-associated polyposis (MAP).  Being overweight.  Certain lifestyle factors. These include smoking cigarettes, drinking too much alcohol, not getting enough exercise, and eating a diet that is high in fat and red meat and low in fiber.  Having had childhood cancer that was treated with radiation of the abdomen. What are the  signs or symptoms? Many times, there are no symptoms. If you have symptoms, they may include:  Blood coming from the rectum during a bowel movement.  Blood in the stool (feces). The blood may be bright red or very dark in color.  Pain in the abdomen.  A change in bowel habits, such as constipation or diarrhea. How is this diagnosed? This condition is diagnosed with a colonoscopy. This is a procedure in which a lighted, flexible scope is inserted into the opening between the buttocks (anus) and then passed into the colon to examine the area. Polyps are sometimes found when a colonoscopy is done as part of routine cancer screening tests. How is this treated? This condition is treated by removing any polyps that are found. Most  polyps can be removed during a colonoscopy. Those polyps will then be tested for cancer. Additional treatment may be needed depending on the results of testing. Follow these instructions at home: Eating and drinking  Eat foods that are high in fiber, such as fruits, vegetables, and whole grains.  Eat foods that are high in calcium and vitamin D, such as milk, cheese, yogurt, eggs, liver, fish, and broccoli.  Limit foods that are high in fat, such as fried foods and desserts.  Limit the amount of red meat, precooked or cured meat, or other processed meat that you eat, such as hot dogs, sausages, bacon, or meat loaves.  Limit sugary drinks.   Lifestyle  Maintain a healthy weight, or lose weight if recommended by your health care provider.  Exercise every day or as told by your health care provider.  Do not use any products that contain nicotine or tobacco, such as cigarettes, e-cigarettes, and chewing tobacco. If you need help quitting, ask your health care provider.  Do not drink alcohol if: ? Your health care provider tells you not to drink. ? You are pregnant, may be pregnant, or are planning to become pregnant.  If you drink alcohol: ? Limit how much you  use to:  0-1 drink a day for women.  0-2 drinks a day for men. ? Know how much alcohol is in your drink. In the [place] one drink equals one 12 oz bottle of beer (355 mL), one 5 oz glass of wine (148 mL), or one 1 oz glass of hard liquor (44 mL). General instructions  Take over-the-counter and prescription medicines only as told by your health care provider.  Keep all follow-up visits. This is important. This includes having regularly scheduled colonoscopies. Talk to your health care provider about when you need a colonoscopy. Contact a health care provider if:  You have new or worsening bleeding during a bowel movement.  You have new or increased blood in your stool.  You have a change in bowel habits.  You lose weight for no known reason. Summary  Colon polyps are tissue growths inside the colon, which is part of the large intestine. They are one type of polyp that can grow in the body.  Most colon polyps are noncancerous (benign), but some can become cancerous over time.  This condition is diagnosed with a colonoscopy.  This condition is treated by removing any polyps that are found. Most polyps can be removed during a colonoscopy. This information is not intended to replace advice given to you by your health care provider. Make sure you discuss any questions you have with your health care provider. Document Revised: 10/04/2019 Document Reviewed: 10/04/2019 Elsevier Patient Education  2021 [place].   No aspirin or NSAIDs for 3 days Can take Tylenol up to 2 g/day on as-needed basis for headache or musculoskeletal pain Resume other medications as before High-fiber diet No driving for 24 hours. Physician will call with biopsy results.

## 2020-08-29 NOTE — Op Note (Signed)
[facility] Patient Name: Jill Braun Procedure Date: 08/29/2020 8:23 AM MRN: [id number] Date of Birth: 1946/01/21 Attending MD: Hildred Laser , MD CSN: [id number] Age: 75 Admit Type: Outpatient Procedure:                Colonoscopy Indications:              High risk colon cancer surveillance: Personal                            history of colonic polyps Providers:                Hildred Laser, MD, Caprice Kluver, Aram Candela Referring MD:             Monico Blitz, MD Medicines:                Meperidine 50 mg IV, Midazolam 8 mg IV Complications:            No immediate complications. Estimated Blood Loss:     Estimated blood loss was minimal. Procedure:                Pre-Anesthesia Assessment:                           - Prior to the procedure, a History and Physical                            was performed, and patient medications and                            allergies were reviewed. The patient's tolerance of                            previous anesthesia was also reviewed. The risks                            and benefits of the procedure and the sedation                            options and risks were discussed with the patient.                            All questions were answered, and informed consent                            was obtained. Prior Anticoagulants: The patient has                            taken no previous anticoagulant or antiplatelet                            agents. ASA Grade Assessment: III - A patient with                            severe systemic disease. After reviewing the risks  and benefits, the patient was deemed in                            satisfactory condition to undergo the procedure.                           After obtaining informed consent, the colonoscope                            was passed under direct vision. Throughout the                            procedure, the patient's blood pressure, pulse, and                             oxygen saturations were monitored continuously. The                            [id number]-HQ190L ([id number]) scope was introduced through                            the anus and advanced to the the cecum, identified                            by appendiceal orifice and ileocecal valve. The                            colonoscopy was performed without difficulty. The                            patient tolerated the procedure well. The quality                            of the bowel preparation was adequate. The                            ileocecal valve, appendiceal orifice, and rectum                            were photographed. Scope In: 8:57:56 AM Scope Out: 58:52:77 AM Scope Withdrawal Time: 0 hours 56 minutes 22 seconds  Total Procedure Duration: 1 hour 8 minutes 22 seconds  Findings:      Skin tags were found on perianal exam.      Scattered diverticula were found in the sigmoid colon and ascending       colon.      Three polyps were found in the cecum. The polyps were 4 to 7 mm in size.       These polyps were removed with a cold snare. Proximal margin of polyp       next to ileocecal valve was ablated with argon plasma coagulator.       Resection and retrieval were complete. The pathology specimen was placed       into Bottle Number 1.      A small polyp was found in the cecum. Biopsies were taken  with a cold       forceps for histology. The pathology specimen was placed into Bottle       Number 1.      Six polyps were found in the splenic flexure, transverse colon and       hepatic flexure. The polyps were 4 to 6 mm in size. These polyps were       removed with a cold snare. Resection and retrieval were complete. The       pathology specimen was placed into Bottle Number 2.      Nine polyps were found in the rectum and sigmoid colon. The polyps were       4 to 8 mm in size. These polyps were removed with a cold snare.       Resection and retrieval were  complete. The pathology specimen was placed       into Bottle Number 3.      External and internal hemorrhoids were found during retroflexion. The       hemorrhoids were small.      Anal papilla(e) were hypertrophied. Impression:               - Perianal skin tags found on perianal exam.                           - Diverticulosis in the sigmoid colon and in the                            ascending colon.                           - Three 4 to 7 mm polyps in the cecum, removed with                            a cold snare. Proximal margin of polyp next to                            ileocecal valve was ablated with APC. Resected and                            retrieved.                           - One small polyp in the cecum. Biopsied.                           - Six 4 to 6 mm polyps at the splenic flexure, in                            the transverse colon and at the hepatic flexure,                            removed with a cold snare. Resected and retrieved.                           - Nine 4 to 8 mm polyps in the rectum and in the  sigmoid colon, removed with a cold snare. Resected                            and retrieved.                           - External and internal hemorrhoids.                           - Anal papilla(e) were hypertrophied.                           Comment: Patient had 19 polyps altogether. Moderate Sedation:      Moderate (conscious) sedation was administered by the endoscopy nurse       and supervised by the endoscopist. The following parameters were       monitored: oxygen saturation, heart rate, blood pressure, CO2       capnography and response to care. Total physician intraservice time was       75 minutes. Recommendation:           - Patient has a contact number available for                            emergencies. The signs and symptoms of potential                            delayed complications were discussed with the                             patient. Return to normal activities tomorrow.                            Written discharge instructions were provided to the                            patient.                           - High fiber diet today.                           - Continue present medications.                           - No aspirin, ibuprofen, naproxen, or other                            non-steroidal anti-inflammatory drugs for 3 days.                           - Await pathology results.                           - Repeat colonoscopy for surveillance based on                            pathology  results. Procedure Code(s):        --- Professional ---                           [id number], Colonoscopy, flexible; with removal of                            tumor(s), polyp(s), or other lesion(s) by snare                            technique                           45380, 59, Colonoscopy, flexible; with biopsy,                            single or multiple                           99153, Moderate sedation; each additional 15                            minutes intraservice time                           99153, Moderate sedation; each additional 15                            minutes intraservice time                           99153, Moderate sedation; each additional 15                            minutes intraservice time                           99153, Moderate sedation; each additional 15                            minutes intraservice time                           G0500, Moderate sedation services provided by the                            same physician or other qualified health care                            professional performing a gastrointestinal                            endoscopic service that sedation supports,                            requiring the presence of an independent trained  observer to assist in the monitoring of the                             patient's level of consciousness and physiological                            status; initial 15 minutes of intra-service time;                            patient age 48 years or older (additional time may                            be reported with [phone], as appropriate) Diagnosis Code(s):        --- Professional ---                           Z86.010, Personal history of colonic polyps                           K64.8, Other hemorrhoids                           K62.1, Rectal polyp                           K63.5, Polyp of colon                           K62.89, Other specified diseases of anus and rectum                           K64.4, Residual hemorrhoidal skin tags                           K57.30, Diverticulosis of large intestine without                            perforation or abscess without bleeding CPT copyright 2019 [employer]. All rights reserved. The codes documented in this report are preliminary and upon coder review may  be revised to meet current compliance requirements. Hildred Laser, MD Hildred Laser, MD 08/29/2020 10:30:59 AM This report has been signed electronically. Number of Addenda: 0

## 2020-08-30 LAB — SURGICAL PATHOLOGY

## 2020-09-03 ENCOUNTER — Encounter (INDEPENDENT_AMBULATORY_CARE_PROVIDER_SITE_OTHER): Payer: Self-pay | Admitting: *Deleted

## 2020-09-04 ENCOUNTER — Encounter (HOSPITAL_COMMUNITY): Payer: Self-pay | Admitting: Internal Medicine

## 2020-11-06 DIAGNOSIS — F17210 Nicotine dependence, cigarettes, uncomplicated: Secondary | ICD-10-CM | POA: Diagnosis not present

## 2020-11-06 DIAGNOSIS — G4700 Insomnia, unspecified: Secondary | ICD-10-CM | POA: Diagnosis not present

## 2020-11-06 DIAGNOSIS — Z299 Encounter for prophylactic measures, unspecified: Secondary | ICD-10-CM | POA: Diagnosis not present

## 2020-11-06 DIAGNOSIS — R5383 Other fatigue: Secondary | ICD-10-CM | POA: Diagnosis not present

## 2020-11-06 DIAGNOSIS — Z79899 Other long term (current) drug therapy: Secondary | ICD-10-CM | POA: Diagnosis not present

## 2020-11-06 DIAGNOSIS — E7800 Pure hypercholesterolemia, unspecified: Secondary | ICD-10-CM | POA: Diagnosis not present

## 2020-11-06 DIAGNOSIS — K219 Gastro-esophageal reflux disease without esophagitis: Secondary | ICD-10-CM | POA: Diagnosis not present

## 2020-11-06 DIAGNOSIS — Z7189 Other specified counseling: Secondary | ICD-10-CM | POA: Diagnosis not present

## 2020-11-06 DIAGNOSIS — Z0000 Encounter for general adult medical examination without abnormal findings: Secondary | ICD-10-CM | POA: Diagnosis not present

## 2020-11-30 IMAGING — US US EXTREM LOW VENOUS*L*
1 series · 13 of 24 positions shown · non-contrast
Comparison: Left lower extremity venous Doppler
ultrasound-05/30/2019

CLINICAL DATA: Left lower extremity pain for the past year.
Evaluate for DVT and/or a Baker's cyst.



[Series 1: us extrem low venous*left* · 0.09mm/px · 13 of 61 slices shown]
[im 1/61]
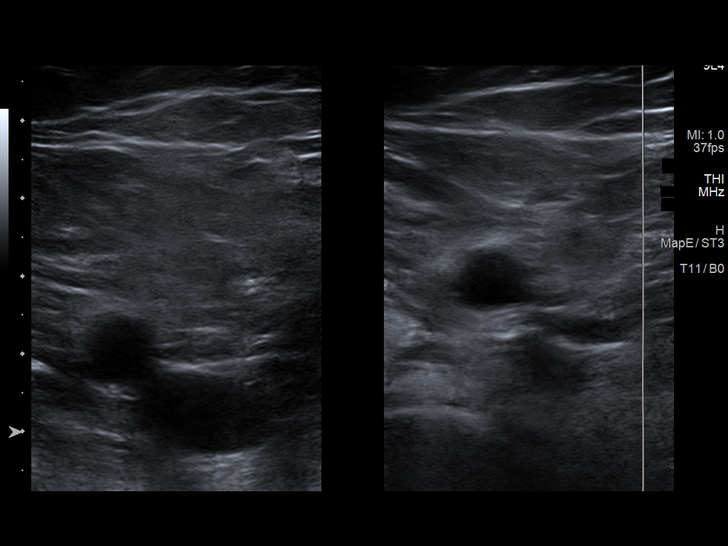
[im 6/61]
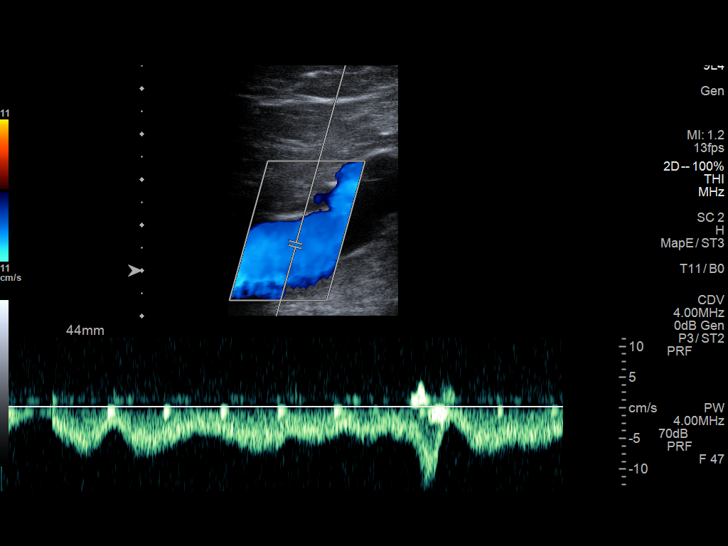
[im 11/61]
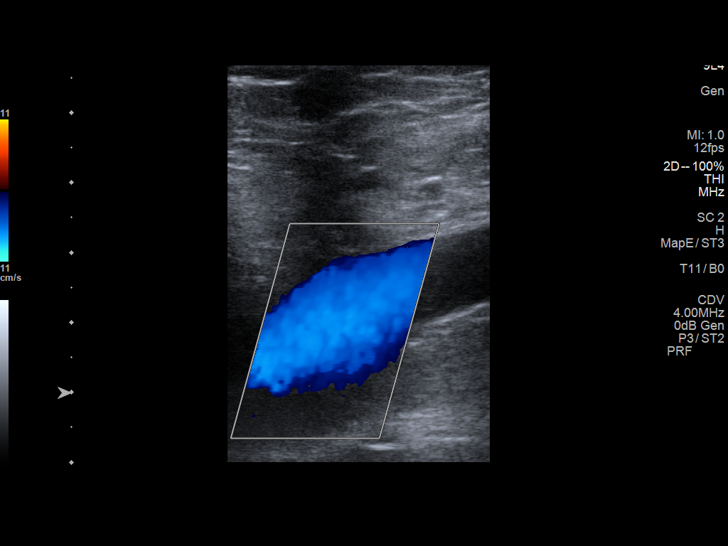
[im 16/61]
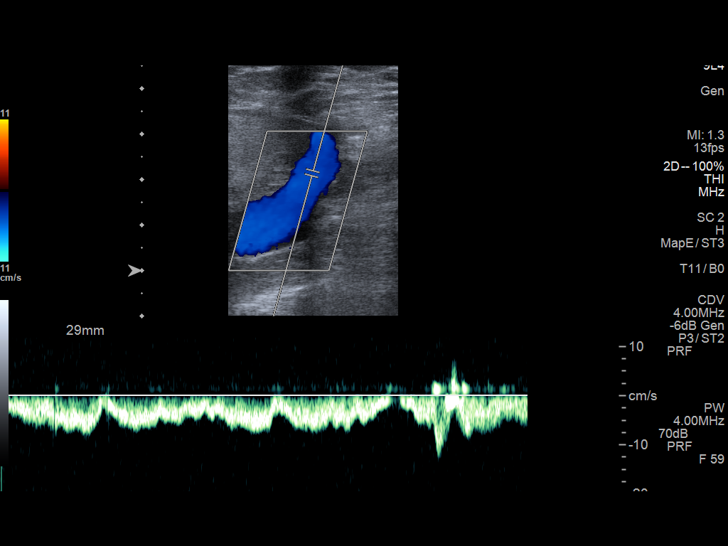
[im 21/61]
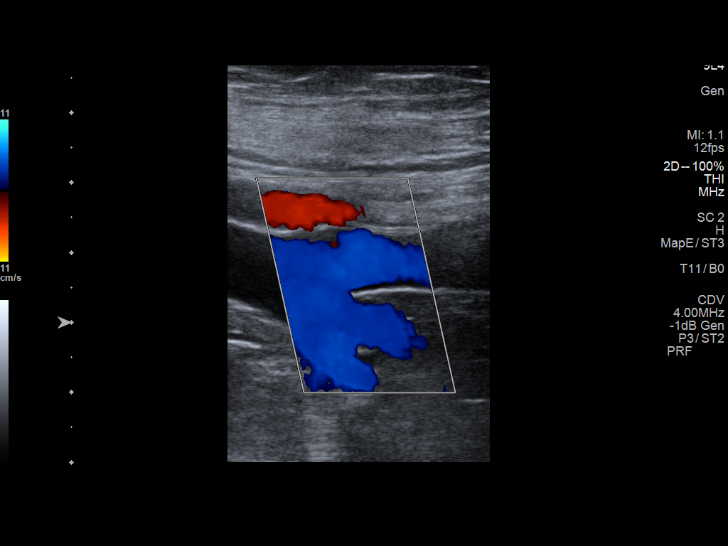
[im 27/61]
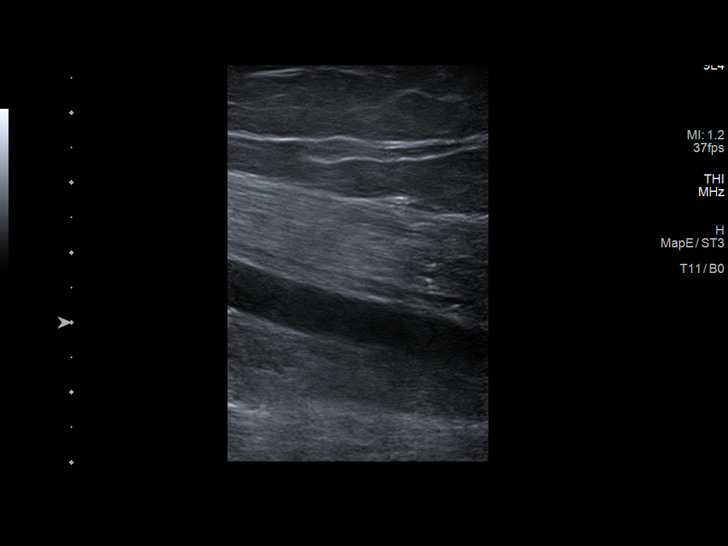
[im 32/61]
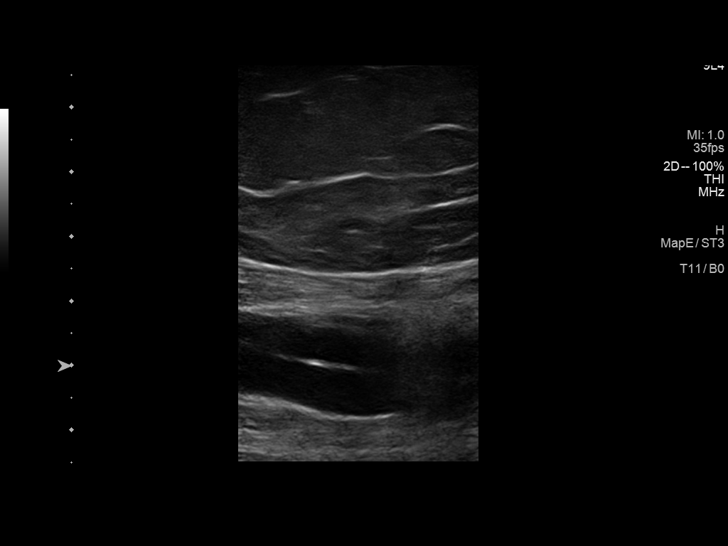
[im 34/61]
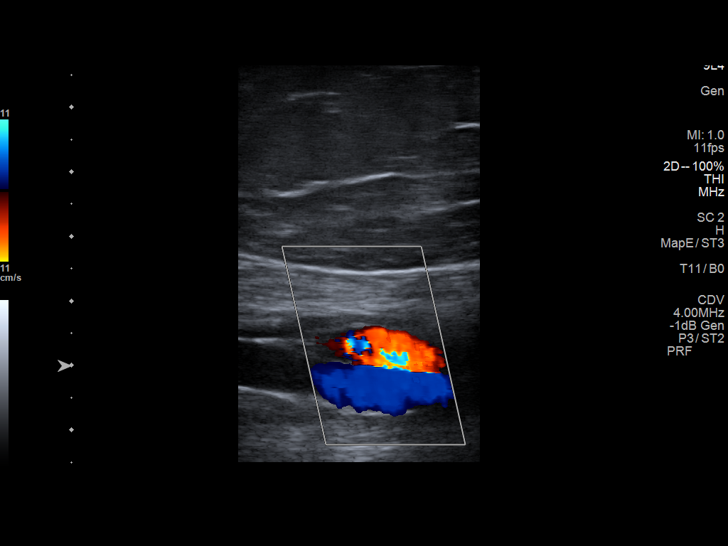
[im 40/61]
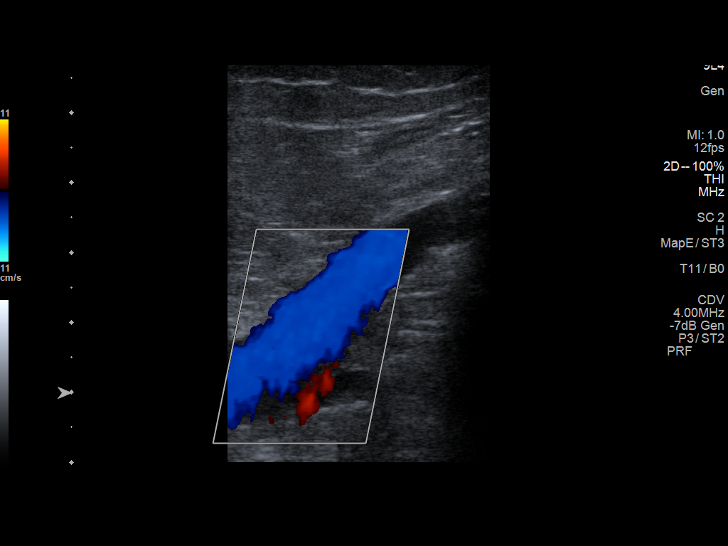
[im 45/61]
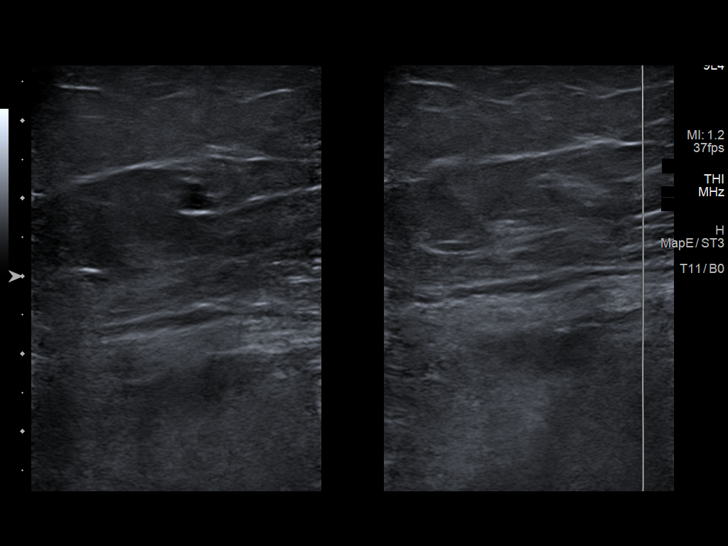
[im 50/61]
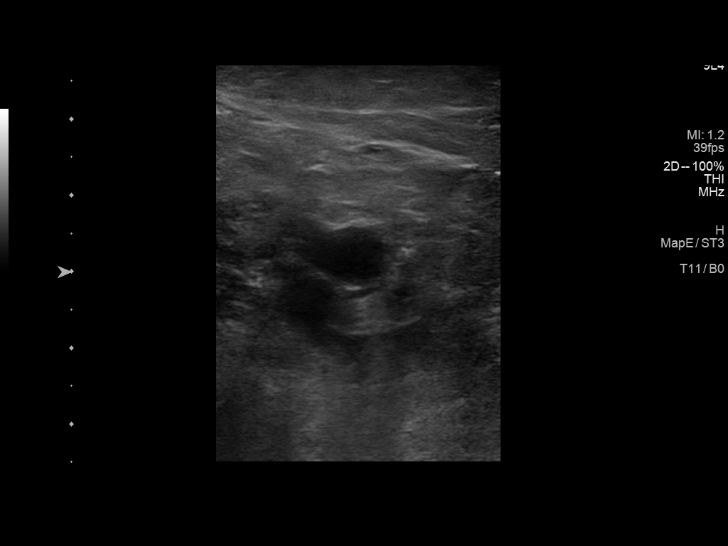
[im 55/61]
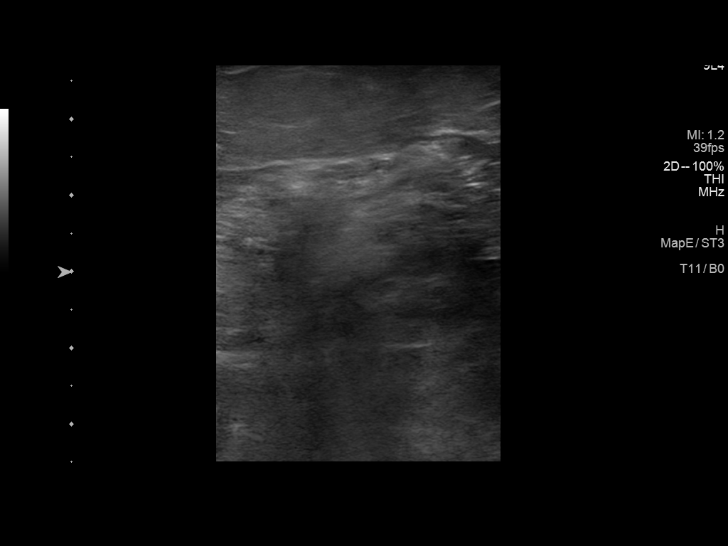
[im 61/61]
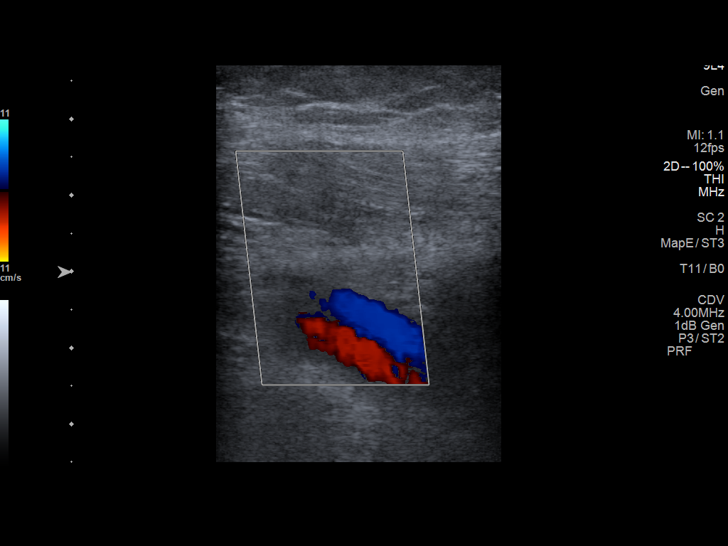

[13 of 24 positions shown; findings below may reference images not displayed]

FINDINGS: Contralateral Common Femoral Vein: Respiratory phasicity is normal
and symmetric with the symptomatic side. No evidence of thrombus.
Normal compressibility.

Common Femoral Vein: No evidence of thrombus. Normal
compressibility, respiratory phasicity and response to augmentation.

Saphenofemoral Junction: No evidence of thrombus. Normal
compressibility and flow on color Doppler imaging.

Profunda Femoral Vein: No evidence of thrombus. Normal
compressibility and flow on color Doppler imaging.

Femoral Vein: No evidence of thrombus. Normal compressibility,
respiratory phasicity and response to augmentation.

Popliteal Vein: No evidence of thrombus. Normal compressibility,
respiratory phasicity and response to augmentation.

Calf Veins: No evidence of thrombus. Normal compressibility and flow
on color Doppler imaging.

Superficial Great Saphenous Vein: No evidence of thrombus. Normal
compressibility.

Venous Reflux:  None.

Other Findings: Previously noted 2.3 cm left-sided Baker's cyst is
not seen on the present examination.
IMPRESSION: 1. No evidence of DVT within the left lower extremity.
2. No evidence of a Baker's cyst. Previously noted 2.3 cm left-sided
Baker's cyst, demonstrated on the [DATE] examination, is not seen
on the present examination.

## 2020-12-17 DIAGNOSIS — K219 Gastro-esophageal reflux disease without esophagitis: Secondary | ICD-10-CM | POA: Diagnosis not present

## 2020-12-17 DIAGNOSIS — F17200 Nicotine dependence, unspecified, uncomplicated: Secondary | ICD-10-CM | POA: Diagnosis not present

## 2020-12-17 DIAGNOSIS — Z23 Encounter for immunization: Secondary | ICD-10-CM | POA: Diagnosis not present

## 2020-12-17 DIAGNOSIS — R609 Edema, unspecified: Secondary | ICD-10-CM | POA: Diagnosis not present

## 2020-12-17 DIAGNOSIS — J449 Chronic obstructive pulmonary disease, unspecified: Secondary | ICD-10-CM | POA: Diagnosis not present

## 2020-12-17 DIAGNOSIS — E559 Vitamin D deficiency, unspecified: Secondary | ICD-10-CM | POA: Diagnosis not present

## 2020-12-17 DIAGNOSIS — R739 Hyperglycemia, unspecified: Secondary | ICD-10-CM | POA: Diagnosis not present

## 2020-12-24 DIAGNOSIS — J441 Chronic obstructive pulmonary disease with (acute) exacerbation: Secondary | ICD-10-CM | POA: Diagnosis not present

## 2020-12-24 DIAGNOSIS — F17200 Nicotine dependence, unspecified, uncomplicated: Secondary | ICD-10-CM | POA: Diagnosis not present

## 2020-12-24 DIAGNOSIS — K219 Gastro-esophageal reflux disease without esophagitis: Secondary | ICD-10-CM | POA: Diagnosis not present

## 2020-12-24 DIAGNOSIS — R609 Edema, unspecified: Secondary | ICD-10-CM | POA: Diagnosis not present

## 2020-12-25 DIAGNOSIS — Z87891 Personal history of nicotine dependence: Secondary | ICD-10-CM | POA: Diagnosis not present

## 2020-12-25 DIAGNOSIS — F17210 Nicotine dependence, cigarettes, uncomplicated: Secondary | ICD-10-CM | POA: Diagnosis not present

## 2020-12-31 DIAGNOSIS — R609 Edema, unspecified: Secondary | ICD-10-CM | POA: Diagnosis not present

## 2020-12-31 DIAGNOSIS — F17200 Nicotine dependence, unspecified, uncomplicated: Secondary | ICD-10-CM | POA: Diagnosis not present

## 2020-12-31 DIAGNOSIS — K219 Gastro-esophageal reflux disease without esophagitis: Secondary | ICD-10-CM | POA: Diagnosis not present

## 2020-12-31 DIAGNOSIS — J449 Chronic obstructive pulmonary disease, unspecified: Secondary | ICD-10-CM | POA: Diagnosis not present

## 2021-01-02 DIAGNOSIS — E042 Nontoxic multinodular goiter: Secondary | ICD-10-CM | POA: Diagnosis not present

## 2021-01-07 DIAGNOSIS — D3000 Benign neoplasm of unspecified kidney: Secondary | ICD-10-CM | POA: Diagnosis not present

## 2021-01-08 DIAGNOSIS — D440 Neoplasm of uncertain behavior of thyroid gland: Secondary | ICD-10-CM | POA: Diagnosis not present

## 2021-01-08 DIAGNOSIS — R591 Generalized enlarged lymph nodes: Secondary | ICD-10-CM | POA: Diagnosis not present

## 2021-01-13 ENCOUNTER — Other Ambulatory Visit (HOSPITAL_COMMUNITY): Payer: Self-pay | Admitting: Otolaryngology

## 2021-01-13 ENCOUNTER — Other Ambulatory Visit: Payer: Self-pay | Admitting: Urology

## 2021-01-13 DIAGNOSIS — E041 Nontoxic single thyroid nodule: Secondary | ICD-10-CM

## 2021-01-13 DIAGNOSIS — D3000 Benign neoplasm of unspecified kidney: Secondary | ICD-10-CM

## 2021-01-15 DIAGNOSIS — F17200 Nicotine dependence, unspecified, uncomplicated: Secondary | ICD-10-CM | POA: Diagnosis not present

## 2021-01-15 DIAGNOSIS — J449 Chronic obstructive pulmonary disease, unspecified: Secondary | ICD-10-CM | POA: Diagnosis not present

## 2021-01-15 DIAGNOSIS — R609 Edema, unspecified: Secondary | ICD-10-CM | POA: Diagnosis not present

## 2021-01-15 DIAGNOSIS — K219 Gastro-esophageal reflux disease without esophagitis: Secondary | ICD-10-CM | POA: Diagnosis not present

## 2021-01-20 ENCOUNTER — Other Ambulatory Visit (HOSPITAL_COMMUNITY): Payer: Self-pay | Admitting: Urology

## 2021-01-20 DIAGNOSIS — D3000 Benign neoplasm of unspecified kidney: Secondary | ICD-10-CM

## 2021-01-22 ENCOUNTER — Other Ambulatory Visit: Payer: Self-pay

## 2021-01-22 ENCOUNTER — Other Ambulatory Visit (HOSPITAL_COMMUNITY)
Admission: RE | Admit: 2021-01-22 | Discharge: 2021-01-22 | Disposition: A | Discharge status: Home / Self Care | Payer: Medicare Other | Source: Ambulatory Visit | Attending: Urology | Admitting: Urology

## 2021-01-22 ENCOUNTER — Ambulatory Visit (HOSPITAL_COMMUNITY)
Admission: RE | Admit: 2021-01-22 | Discharge: 2021-01-22 | Disposition: A | Discharge status: Home / Self Care | Payer: Medicare Other | Source: Ambulatory Visit | Attending: Otolaryngology | Admitting: Otolaryngology

## 2021-01-22 DIAGNOSIS — N6489 Other specified disorders of breast: Secondary | ICD-10-CM | POA: Diagnosis not present

## 2021-01-22 DIAGNOSIS — E041 Nontoxic single thyroid nodule: Secondary | ICD-10-CM | POA: Diagnosis not present

## 2021-01-22 DIAGNOSIS — D3000 Benign neoplasm of unspecified kidney: Secondary | ICD-10-CM | POA: Insufficient documentation

## 2021-01-22 DIAGNOSIS — R591 Generalized enlarged lymph nodes: Secondary | ICD-10-CM | POA: Diagnosis not present

## 2021-01-22 DIAGNOSIS — R922 Inconclusive mammogram: Secondary | ICD-10-CM | POA: Diagnosis not present

## 2021-01-22 LAB — BUN: BUN: 11 mg/dL (ref 8–23)

## 2021-01-22 LAB — CREATININE, SERUM
Creatinine, Ser: 0.75 mg/dL (ref 0.44–1.00)
GFR, Estimated: >60 mL/min (ref >60)

## 2021-01-22 MED ORDER — LIDOCAINE HCL 2 % IJ SOLN
INTRAMUSCULAR | Status: AC
  Filled 2021-01-22: qty 10

## 2021-01-23 LAB — CYTOLOGY - NON PAP

## 2021-02-10 ENCOUNTER — Ambulatory Visit (HOSPITAL_COMMUNITY)
Admission: RE | Admit: 2021-02-10 | Discharge: 2021-02-10 | Disposition: A | Discharge status: Home / Self Care | Payer: Medicare Other | Source: Ambulatory Visit | Attending: Urology | Admitting: Urology

## 2021-02-10 ENCOUNTER — Other Ambulatory Visit: Payer: Self-pay

## 2021-02-10 DIAGNOSIS — K5730 Diverticulosis of large intestine without perforation or abscess without bleeding: Secondary | ICD-10-CM | POA: Diagnosis not present

## 2021-02-10 DIAGNOSIS — K7689 Other specified diseases of liver: Secondary | ICD-10-CM | POA: Diagnosis not present

## 2021-02-10 DIAGNOSIS — I700 Atherosclerosis of aorta: Secondary | ICD-10-CM | POA: Insufficient documentation

## 2021-02-10 DIAGNOSIS — D3000 Benign neoplasm of unspecified kidney: Secondary | ICD-10-CM | POA: Insufficient documentation

## 2021-02-10 DIAGNOSIS — N2889 Other specified disorders of kidney and ureter: Secondary | ICD-10-CM | POA: Diagnosis not present

## 2021-02-10 DIAGNOSIS — N289 Disorder of kidney and ureter, unspecified: Secondary | ICD-10-CM | POA: Insufficient documentation

## 2021-02-10 DIAGNOSIS — D7389 Other diseases of spleen: Secondary | ICD-10-CM | POA: Diagnosis not present

## 2021-02-10 MED ORDER — IOHEXOL 350 MG/ML SOLN
80.0000 mL | Freq: Once | INTRAVENOUS | Status: AC | PRN
  Administered 2021-02-10: 80 mL via INTRAVENOUS

## 2021-02-17 DIAGNOSIS — E041 Nontoxic single thyroid nodule: Secondary | ICD-10-CM | POA: Diagnosis not present

## 2021-02-17 DIAGNOSIS — F17200 Nicotine dependence, unspecified, uncomplicated: Secondary | ICD-10-CM | POA: Diagnosis not present

## 2021-02-17 DIAGNOSIS — J449 Chronic obstructive pulmonary disease, unspecified: Secondary | ICD-10-CM | POA: Diagnosis not present

## 2021-02-17 DIAGNOSIS — R609 Edema, unspecified: Secondary | ICD-10-CM | POA: Diagnosis not present

## 2021-02-17 DIAGNOSIS — K219 Gastro-esophageal reflux disease without esophagitis: Secondary | ICD-10-CM | POA: Diagnosis not present

## 2021-02-17 DIAGNOSIS — R599 Enlarged lymph nodes, unspecified: Secondary | ICD-10-CM | POA: Diagnosis not present

## 2021-02-18 DIAGNOSIS — N281 Cyst of kidney, acquired: Secondary | ICD-10-CM | POA: Diagnosis not present

## 2021-03-16 ENCOUNTER — Other Ambulatory Visit: Payer: Self-pay

## 2021-03-16 ENCOUNTER — Emergency Department (HOSPITAL_COMMUNITY)
Admission: EM | Admit: 2021-03-16 | Discharge: 2021-03-16 | Disposition: A | Discharge status: Home / Self Care | Payer: Medicare Other | Attending: Emergency Medicine | Admitting: Emergency Medicine

## 2021-03-16 ENCOUNTER — Emergency Department (HOSPITAL_COMMUNITY): Payer: Medicare Other

## 2021-03-16 ENCOUNTER — Encounter (HOSPITAL_COMMUNITY): Payer: Self-pay | Admitting: *Deleted

## 2021-03-16 DIAGNOSIS — M25561 Pain in right knee: Secondary | ICD-10-CM | POA: Diagnosis not present

## 2021-03-16 DIAGNOSIS — F17210 Nicotine dependence, cigarettes, uncomplicated: Secondary | ICD-10-CM | POA: Insufficient documentation

## 2021-03-16 DIAGNOSIS — Z79899 Other long term (current) drug therapy: Secondary | ICD-10-CM | POA: Diagnosis not present

## 2021-03-16 DIAGNOSIS — J441 Chronic obstructive pulmonary disease with (acute) exacerbation: Secondary | ICD-10-CM | POA: Insufficient documentation

## 2021-03-16 DIAGNOSIS — I10 Essential (primary) hypertension: Secondary | ICD-10-CM | POA: Diagnosis not present

## 2021-03-16 DIAGNOSIS — Z96652 Presence of left artificial knee joint: Secondary | ICD-10-CM | POA: Insufficient documentation

## 2021-03-16 DIAGNOSIS — M25461 Effusion, right knee: Secondary | ICD-10-CM

## 2021-03-16 MED ORDER — OXYCODONE-ACETAMINOPHEN 5-325 MG PO TABS: 1.0000 | ORAL_TABLET | Freq: Once | ORAL | Status: DC

## 2021-03-16 MED ORDER — HYDROCODONE-ACETAMINOPHEN 5-325 MG PO TABS
1.0000 | ORAL_TABLET | Freq: Once | ORAL | Status: AC
  Administered 2021-03-16: 1 via ORAL
  Filled 2021-03-16: qty 1

## 2021-03-16 MED ORDER — NAPROXEN 375 MG PO TABS: 375.0000 mg | ORAL_TABLET | Freq: Two times a day (BID) | ORAL | 0 refills | Status: DC

## 2021-03-16 MED ORDER — HYDROCODONE-ACETAMINOPHEN 5-325 MG PO TABS: 1.0000 | ORAL_TABLET | Freq: Four times a day (QID) | ORAL | 0 refills | Status: DC | PRN

## 2021-03-16 NOTE — ED Triage Notes (Addendum)
Pt with right knee for almost a week.  Denies any injury to knee.  Pt states pain has gotten worse and not able to put weight on that knee.

## 2021-03-16 NOTE — Discharge Instructions (Addendum)
Continue using ice and heat therapy as you are doing - I recommend heat applied 20 minutes 2-3 times daily.  Use ice as much as desired which can help with pain and swelling as well. Minimize bending your knee as this motion can worsen swelling.  I also recommend an anti inflammatory pain medicine that has been prescribed.  You may take the hydrocodone prescribed for pain relief.  This will make you drowsy - do not drive within 4 hours of taking this medication.  This medicine contains tylenol (acetaminophen).  Do not take additional tylenol if you take this pain pill.

## 2021-03-17 NOTE — ED Provider Notes (Signed)
[facility] EMERGENCY DEPARTMENT Provider Note   CSN: [id number] Arrival date & time: 03/16/21  1631     History Chief Complaint  Patient presents with   Knee Pain    Jill Braun is a 75 y.o. female with history of COPD, GERD, osteoarthritis of the left knee s/p arthroplasty ([place] 2020) presenting with medial right knee pain x 1 week after overuse with prolonged standing, but denies specific injury.  Her pain has become increasingly painful and today has hardly been able to bear weight due to pain intensity. She notes swelling of the knee, denies fevers, chills or other complaints.  She has used ice and heat with equivocal sx relief.  The history is provided by the patient.      Past Medical History:  Diagnosis Date   COPD (chronic obstructive pulmonary disease) ([place]) 06/02/2016   Food poisoning 2019   AT [facility]   GERD (gastroesophageal reflux disease)    Headache    History of cardiac catheterization    Normal coronary arteries 2009   Hyperlipidemia    Middle cerebral artery aneurysm    Left - [facility] 2003   Primary localized osteoarthritis of left knee 03/08/2019    Patient Active Problem List   Diagnosis Date Noted   COPD  Group ?  / active smoker  01/26/2020   DOE (dyspnea on exertion) 01/26/2020   Upper airway cough syndrome 01/26/2020   Cigarette smoker 01/26/2020   Primary localized osteoarthritis of left knee 03/08/2019   Middle cerebral artery aneurysm    Essential hypertension    Irritable bowel syndrome with diarrhea 06/01/2017   GERD (gastroesophageal reflux disease) 06/02/2016   COPD exacerbation ([address]) 06/02/2016    Past Surgical History:  Procedure Laterality Date   ABDOMINAL HYSTERECTOMY     1 OVARY LEFT   BIOPSY  07/02/2017   Procedure: BIOPSY;  Surgeon: Rogene Houston, MD;  Location: [facility];  Service: Endoscopy;;  colon   BREAST SURGERY  1993   REDUCTION    CATARACT EXTRACTION W/PHACO Right 02/01/2014   Procedure: CATARACT  EXTRACTION PHACO AND INTRAOCULAR LENS PLACEMENT ([place]);  Surgeon: Tonny Branch, MD;  Location: [facility] ORS;  Service: Ophthalmology;  Laterality: Right;  CDE 7.59   CATARACT EXTRACTION W/PHACO Left 02/22/2014   Procedure: CATARACT EXTRACTION PHACO AND INTRAOCULAR LENS PLACEMENT ([facility]);  Surgeon: Tonny Branch, MD;  Location: [facility] ORS;  Service: Ophthalmology;  Laterality: Left;  CDE 8.12   Cerebral aneurysm surgery     2003 at [place]   COLONOSCOPY N/A 07/02/2017   Procedure: COLONOSCOPY;  Surgeon: Rogene Houston, MD;  Location: [facility];  Service: Endoscopy;  Laterality: N/A;  7:30   COLONOSCOPY N/A 08/29/2020   Procedure: COLONOSCOPY;  Surgeon: Rogene Houston, MD;  Location: [facility];  Service: Endoscopy;  Laterality: N/A;  8:15   POLYPECTOMY  07/02/2017   Procedure: POLYPECTOMY;  Surgeon: Rogene Houston, MD;  Location: [facility];  Service: Endoscopy;;  colon   POLYPECTOMY  08/29/2020   Procedure: POLYPECTOMY;  Surgeon: Rogene Houston, MD;  Location: [facility];  Service: Endoscopy;;   TOTAL KNEE ARTHROPLASTY Left 03/20/2019   Procedure: TOTAL KNEE ARTHROPLASTY;  Surgeon: Elsie Saas, MD;  Location: [facility] ORS;  Service: Orthopedics;  Laterality: Left;     OB History   No obstetric history on file.     Family History  Problem Relation Age of Onset   Breast cancer Mother  Heart attack Mother    Heart attack Father    Diabetes Mellitus II Father    Colon cancer Neg Hx     Social History   Tobacco Use   Smoking status: Some Days    Packs/day: 2.00    Years: 47.00    Pack years: 94.00    Types: Cigarettes   Smokeless tobacco: Never  Vaping Use   Vaping Use: Never used  Substance Use Topics   Alcohol use: No   Drug use: No    Home Medications Prior to Admission medications   Medication Sig Start Date End Date Taking? Authorizing Provider  HYDROcodone-acetaminophen (NORCO/VICODIN) 5-325 MG tablet Take 1 tablet by mouth every 6 (six) hours as  needed for moderate pain. 03/16/21  Yes Christi Wirick, Almyra Free, PA-C  naproxen (NAPROSYN) 375 MG tablet Take 1 tablet (375 mg total) by mouth 2 (two) times daily. 03/16/21  Yes Earlin Sweeden, Almyra Free, PA-C  acetaminophen (TYLENOL) 500 MG tablet Take 500 mg by mouth every 6 (six) hours as needed for moderate pain or headache.    [provider]  albuterol (ACCUNEB) 1.25 MG/3ML nebulizer solution Take 1 ampule by nebulization every 6 (six) hours as needed for wheezing.    [provider]  Albuterol Sulfate 108 (90 Base) MCG/ACT AEPB Inhale 1 puff into the lungs every 6 (six) hours as needed (for shortness of breath/wheezing).     [provider]  esomeprazole (NEXIUM) 40 MG capsule Take 40 mg by mouth daily.     [provider]  Fluticasone-Umeclidin-Vilant (TRELEGY ELLIPTA) 100-62.5-25 MCG/INH AEPB Inhale 1 puff into the lungs daily as needed (shortness of breath).    [provider]    Allergies    Patient has no known allergies.  Review of Systems   Review of Systems  Constitutional:  Negative for chills and fever.  Musculoskeletal:  Positive for arthralgias and joint swelling. Negative for myalgias.  Neurological:  Negative for weakness and numbness.  All other systems reviewed and are negative.  Physical Exam Updated Vital Signs BP 110/84   Pulse 78   Temp 98.5 F (36.9 C) (Oral)   Resp 18   Ht 5' 1.5" (1.562 m)   Wt 84.8 kg   SpO2 95%   BMI 34.76 kg/m   Physical Exam Constitutional:      Appearance: She is well-developed.  HENT:     Head: Atraumatic.  Cardiovascular:     Comments: Pulses equal bilaterally Musculoskeletal:        General: Tenderness present.     Cervical back: Normal range of motion.     Right knee: Effusion, bony tenderness and crepitus present. No erythema or ecchymosis.     Comments: Ttp medial knee joint space, mild crepitus noted with active and passive ROM.  No ligament instability, pain worsens with valgus strain medially. No  obvious meniscal deficit. Small effusion appreciated.  No erythema or skin wounds.   Skin:    General: Skin is warm and dry.  Neurological:     Mental Status: She is alert.     Sensory: No sensory deficit.     Motor: No weakness.     Deep Tendon Reflexes: Reflexes normal.    ED Results / Procedures / Treatments   Labs (all labs ordered are listed, but only abnormal results are displayed) Labs Reviewed - No data to display  EKG None  Radiology DG Knee Complete 4 Views Right  Result Date: 03/16/2021 CLINICAL DATA:  Acute RIGHT knee pain.  No known injury. Initial encounter. EXAM: RIGHT KNEE - COMPLETE 4+ VIEW COMPARISON:  None. FINDINGS: There is no evidence of acute fracture, subluxation or dislocation. A joint effusion is noted. Chondrocalcinosis is present. No focal bony lesions are identified. IMPRESSION: 1. Joint effusion without acute bony abnormality. 2. Chondrocalcinosis/CPPD. 3. No other significant abnormalities. Electronically Signed   By: Margarette Canada M.D.   On: 03/16/2021 17:37    Procedures Procedures   Medications Ordered in ED Medications  HYDROcodone-acetaminophen (NORCO/VICODIN) 5-325 MG per tablet 1 tablet (1 tablet Oral Given 03/16/21 1948)    ED Course  I have reviewed the triage vital signs and the nursing notes.  Pertinent labs & imaging results that were available during my care of the patient were reviewed by me and considered in my medical decision making (see chart for details).    MDM Rules/Calculators/A&P                           Imaging reviewed and discussed with pt. Small effusion with doubtful therapeutic benefit of aspiration.  Knee brace provided for compression. Discussed ice/heat tx, prescribed naproxen and hydrocdone - caution advised. Review of chart - pt had oxycodone after left knee surgery - she states she tolerated this well.  Advised close f/u with Dr. Noemi Chapel.  No signs or risk for septic joint.  Pt does not have hx of IVDU, no hx of  gout or pseudogout.  Offered walker prescription, pt defers to using her cane. Plan f/u with ortho. Final Clinical Impression(s) / ED Diagnoses Final diagnoses:  Effusion of right knee    Rx / DC Orders ED Discharge Orders          Ordered    HYDROcodone-acetaminophen (NORCO/VICODIN) 5-325 MG tablet  Every 6 hours PRN        03/16/21 1933    naproxen (NAPROSYN) 375 MG tablet  2 times daily        03/16/21 1939             Evalee Jefferson, PA-C 03/17/21 2229    Horton, Alvin Critchley, DO 03/20/21 1446

## 2021-03-18 DIAGNOSIS — S83241A Other tear of medial meniscus, current injury, right knee, initial encounter: Secondary | ICD-10-CM | POA: Diagnosis not present

## 2021-04-15 DIAGNOSIS — F17200 Nicotine dependence, unspecified, uncomplicated: Secondary | ICD-10-CM | POA: Diagnosis not present

## 2021-04-15 DIAGNOSIS — J029 Acute pharyngitis, unspecified: Secondary | ICD-10-CM | POA: Diagnosis not present

## 2021-04-15 DIAGNOSIS — J441 Chronic obstructive pulmonary disease with (acute) exacerbation: Secondary | ICD-10-CM | POA: Diagnosis not present

## 2021-04-22 DIAGNOSIS — J441 Chronic obstructive pulmonary disease with (acute) exacerbation: Secondary | ICD-10-CM | POA: Diagnosis not present

## 2021-04-30 DIAGNOSIS — R590 Localized enlarged lymph nodes: Secondary | ICD-10-CM | POA: Diagnosis not present

## 2021-05-06 DIAGNOSIS — E041 Nontoxic single thyroid nodule: Secondary | ICD-10-CM | POA: Diagnosis not present

## 2021-05-06 DIAGNOSIS — Z1389 Encounter for screening for other disorder: Secondary | ICD-10-CM | POA: Diagnosis not present

## 2021-05-06 DIAGNOSIS — R599 Enlarged lymph nodes, unspecified: Secondary | ICD-10-CM | POA: Diagnosis not present

## 2021-05-06 DIAGNOSIS — J449 Chronic obstructive pulmonary disease, unspecified: Secondary | ICD-10-CM | POA: Diagnosis not present

## 2021-05-06 DIAGNOSIS — F17200 Nicotine dependence, unspecified, uncomplicated: Secondary | ICD-10-CM | POA: Diagnosis not present

## 2021-05-06 DIAGNOSIS — R609 Edema, unspecified: Secondary | ICD-10-CM | POA: Diagnosis not present

## 2021-05-06 DIAGNOSIS — R3915 Urgency of urination: Secondary | ICD-10-CM | POA: Diagnosis not present

## 2021-05-15 ENCOUNTER — Encounter (INDEPENDENT_AMBULATORY_CARE_PROVIDER_SITE_OTHER): Payer: Self-pay | Admitting: Gastroenterology

## 2021-05-15 ENCOUNTER — Ambulatory Visit (INDEPENDENT_AMBULATORY_CARE_PROVIDER_SITE_OTHER): Payer: Medicare Other | Admitting: Gastroenterology

## 2021-05-15 ENCOUNTER — Other Ambulatory Visit: Payer: Self-pay

## 2021-05-15 VITALS — BP 147/81 | HR 109 | Temp 98.5°F | Ht 61.5 in | Wt 186.2 lb

## 2021-05-15 DIAGNOSIS — K7460 Unspecified cirrhosis of liver: Secondary | ICD-10-CM

## 2021-05-15 NOTE — Progress Notes (Signed)
Referring Provider: [place] Nation, MD Primary Care Physician:  [place] Nation, MD Primary GI Physician: Rehman  Chief Complaint  Patient presents with   elevated liver enzymes    States pcp told her she may have fatty liver, gaining weight and not eating much, has some constipation, takes miralax as needed and states it does help.    HPI:   Jill Braun is a 75 y.o. female with past medical history of COPD, GERD, HLD, middle cerebral artery aneurysm, OA of L knee.  Patient presenting today at request of her PCP for concern of fatty liver.  Patient had CT Abdomen wo contrast in aug 2022 with findings to suggest slight nodularity of hepatic contour with widening of the hepatic fissures, possibly reflecting cirrhosis. No suspicious hepatic lesions. No recent labs available for review. She reports that PCP did acute hepatitis labs when she started seeing him earlier this year, she reports she is up to date on Hepatitis immunizations.   She reports that her appetite is decent, she lost about 9 pounds doing the DASH diet, however, she is no longer doing this diet. 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   She denies any ascites. She has had some edema to her L leg in the past and was put on diuretics but she is no longer on these. She is no longer noticing LE edema. She has no episodes of confusion. She denies rectal bleeding or melena. She does have some constipation. She takes miralax for her constipation as needed, this works well for her. She has one BM everyday. She denies nausea or vomiting. She denies jaundice, pruritus. She does endorse easy bruising, she is not on blood thinners.   She denies any history of liver issues in the past, she does not drink alcohol.   NSAID use:she takes ibuprofen as needed Social hx:no etoh, tobacco use-7 cigs per day Fam BP:ZWCHEN had Hepatitis with cirrhosis  Last Colonoscopy:08/29/20- Perianal skin tags found on  perianal exam. - Diverticulosis in the sigmoid colon and in the ascending colon. - Three 4 to 7 mm polyps in the cecum, removed with a cold snare. Proximal margin of polyp next to ileocecal valve was ablated with APC. Sessile serrated - One small polyp in the cecum. Tubular adenoma - Six 4 to 6 mm polyps at the splenic flexure, in the transverse colon and at the hepatic flexure, hyperplastic polyp - Nine 4 to 8 mm polyps in the rectum and in the sigmoid colon, hyperplastic polyp - External and internal hemorrhoids. - Anal papilla(e) were hypertrophied. Last Endoscopy:n/a  Recommendations:  Repeat colonoscopy 3 years  Past Medical History:  Diagnosis Date   COPD (chronic obstructive pulmonary disease) ([place]) 06/02/2016   Food poisoning 2019   AT [facility]   GERD (gastroesophageal reflux disease)    Headache    History of cardiac catheterization    Normal coronary arteries 2009   Hyperlipidemia    Middle cerebral artery aneurysm    Left - [facility] 2003   Primary localized osteoarthritis of left knee 03/08/2019    Past Surgical History:  Procedure Laterality Date   ABDOMINAL HYSTERECTOMY     1 OVARY LEFT   BIOPSY  07/02/2017   Procedure: BIOPSY;  Surgeon: Rogene Houston, MD;  Location: [facility] END[facility];  Service: Endoscopy;;  colon   [place] EXTRACTION W/PHACO Right 02/01/2014  Procedure: CATARACT EXTRACTION PHACO AND INTRAOCULAR LENS PLACEMENT (IOC);  Surgeon: Tonny Branch, MD;  Location: [facility] ORS;  Service: Ophthalmology;  Laterality: Right;  CDE 7.59   CATARACT EXTRACTION W/PHACO Left 02/22/2014   Procedure: CATARACT EXTRACTION PHACO AND INTRAOCULAR LENS PLACEMENT (IOC);  Surgeon: Tonny Branch, MD;  Location: [facility] ORS;  Service: Ophthalmology;  Laterality: Left;  CDE 8.12   Cerebral aneurysm surgery     2003 at [place]   COLONOSCOPY N/A 07/02/2017   Procedure: COLONOSCOPY;  Surgeon: Rogene Houston, MD;  Location: [facility]  ENDO SUITE;  Service: Endoscopy;  Laterality: N/A;  7:30   COLONOSCOPY N/A 08/29/2020   Procedure: COLONOSCOPY;  Surgeon: Rogene Houston, MD;  Location: [facility] ENDO SU[facility];  Service: Endoscopy;  Laterality: N/A;  8:15   POLYPECTOMY  07/02/2017   Procedure: POLYPECTOMY;  Surgeon: Rogene Houston, MD;  Location: [facility];  Service: Endoscopy;;  colon   POLYPECTOMY  08/29/2020   Procedure: POLYPECTOMY;  Surgeon: Rogene Houston, MD;  Location: [facility] END[facility];  Service: Endoscopy;;   TOTAL KNEE ARTHROPLASTY Left 03/20/2019   Procedure: TOTAL KNEE ARTHROPLASTY;  Surgeon: Elsie Saas, MD;  Location: [facility] ORS;  Service: Orthopedics;  Laterality: Left;    Current Outpatient Medications  Medication Sig Dispense Refill   acetaminophen (TYLENOL) 500 MG tablet Take 500 mg by mouth every 6 (six) hours as needed for moderate pain or headache.     albuterol (ACCUNEB) 1.25 MG/3ML nebulizer solution Take 1 ampule by nebulization every 6 (six) hours as needed for wheezing.     Albuterol Sulfate 108 (90 Base) MCG/ACT AEPB Inhale 1 puff into the lungs every 6 (six) hours as needed (for shortness of breath/wheezing).      Fluticasone-Umeclidin-Vilant (TRELEGY ELLIPTA) 100-62.5-25 MCG/INH AEPB Inhale 1 puff into the lungs daily as needed (shortness of breath).     Multiple Vitamin (MULTIVITAMIN) tablet Take 1 tablet by mouth daily.     Omega-3 Fatty Acids (FISH OIL) 1000 MG CAPS Take by mouth. 2 daily     pantoprazole (PROTONIX) 40 MG tablet Take 40 mg by mouth daily. qam     No current facility-administered medications for this visit.    Allergies as of 05/15/2021   (No Known Allergies)    Family History  Problem Relation Age of Onset   Breast cancer Mother    Heart attack Mother    Heart attack Father    Diabetes Mellitus II Father    Colon cancer Neg Hx     Social History   Socioeconomic History   Marital status: Widowed    Spouse name: Not on file   Number of children: Not on file   Years of  education: Not on file   Highest education level: Not on file  Occupational History   Not on file  Tobacco Use   Smoking status: Some Days    Packs/day: 2.00    Years: 47.00    Pack years: 94.00    Types: Cigarettes   Smokeless tobacco: Never  Vaping Use   Vaping Use: Never used  Substance and Sexual Activity   Alcohol use: No   Drug use: No   Sexual activity: Yes  Other Topics Concern   Not on file  Social History Narrative   Not on file   Social Determinants of Health   Financial Resource Strain: Not on file  Food Insecurity: Not on file  Transportation Needs: Not on file  Physical Activity:  Not on file  Stress: Not on file  Social Connections: Not on file   Review of systems General: negative for malaise, night sweats, fever, chills, weight loss Neck: Negative for lumps, goiter, pain and significant neck swelling Resp: Negative for cough, wheezing, dyspnea at rest CV: Negative for chest pain, leg swelling, palpitations, orthopnea GI: denies melena, hematochezia, nausea, vomiting, diarrhea, dysphagia, odyonophagia, early satiety or unintentional weight loss. +constipation MSK: Negative for joint pain or swelling, back pain, and muscle pain. Derm: Negative for itching or rash Psych: Denies depression, anxiety, memory loss, confusion. No homicidal or suicidal ideation.  Heme: Negative for prolonged bleeding, bruising easily, and swollen nodes. Endocrine: Negative for cold or heat intolerance, polyuria, polydipsia and goiter. Neuro: negative for tremor, gait imbalance, syncope and seizures. The remainder of the review of systems is noncontributory.  Physical Exam: BP (!) 147/81 (BP Location: Left Arm, Patient Position: Sitting, Cuff Size: Large)   Pulse (!) 109   Temp 98.5 F (36.9 C) (Oral)   Ht 5' 1.5" (1.562 m)   Wt 186 lb 3.4 oz (84.5 kg)   BMI 34.61 kg/m  General:   Alert and oriented. No distress noted. Pleasant and cooperative.  Head:  Normocephalic and  atraumatic. Eyes:  Conjuctiva clear without scleral icterus. Mouth:  Oral mucosa pink and moist. Good dentition. No lesions. Heart: Normal rate and rhythm, s1 and s2 heart sounds present.  Lungs: Clear lung sounds in all lobes. Respirations equal and unlabored. Abdomen:  +BS, soft, non-tender and non-distended. No rebound or guarding. No HSM or masses noted. Derm: No palmar erythema or jaundice Msk:  Symmetrical without gross deformities. Normal posture. Extremities:  Without edema. Neurologic:  Alert and  oriented x4. No asterixis Psych:  Alert and cooperative. Normal mood and affect.  Invalid input(s): 6 MONTHS   ASSESSMENT: Jill Braun is a 75 y.o. female presenting today for concerns of cirrhosis as noted on CT A/P in august 2022.  Patient denies any history of alcohol use, cirrhosis Is likely secondary to NASH but will require further evaluation for final determination. We will obtain US complete abd with elastography, update MELD labs and obtain recent labs from PCP as patient thinks PCP checked acute hepatitis panel. She is reportedly up to date on Hepatitis vaccines. Will need to add on acute hep panel if this was not checked. Depending on LFTs, will consider autoimmune hepatitis serologies in the future if indicated. She denies swelling to her abdomen, LEs, confusion, jaundice, hematemesis. She does endorse some easy bruising and is not on blood thinners.   I discussed implications of findings of cirrhosis and further workup needed to complete her evaluation. She was advised to avoid NSAIDs. She should continue with regular exercise with goal of 10%decrease in her weight. I have provided mediterranean diet information as well. She will let me know if she develops jaundice, swelling to abdomen or legs, confusion, vomiting or rectal bleeding.    PLAN:  - Reduce salt intake to <2 g per day - Can take Tylenol max of 2 g per day (650 mg q8h) for pain - Avoid NSAIDs for pain - Avoid  eating raw oysters/shellfish -[place] elastography -obtain labs from PCP -we will check CMP, CBC, INR, AFP today, will add on acute hep panel if PCP did not do this (pt is pretty sure he did) -start mediterranean diet  Follow Up: 6 months   Jill Braun L. Alver Sorrow, MSN, APRN, AGNP-C Adult-Gerontology Nurse Practitioner [facility]

## 2021-05-15 NOTE — Patient Instructions (Signed)
We will obtain labs from your PCP, I am going to go ahead and order [place] for further evaluation of your liver as well as basic liver labs so that we can make sure these are up to date. Depending on what labs your PCP drew, I may need to add some on after I receive those.  Please continue to exercise, with goal of 10% decrease in your weight to help prevent further damage to your liver. I am providing mediterranean diet information for you to try as it is important to maintain a healthy, low fat, low sodium diet.  Please let me know if you develop any swelling to abdomen or legs, confusion, yellowing of skin or eyes, blood in stools or vomiting with blood.  - Reduce salt intake to <2 g per day - Can take Tylenol max of 2 g per day (650 mg q8h) for pain - Avoid NSAIDs for pain - Avoid eating raw oysters/shellfish  Follow up 6 months

## 2021-05-16 LAB — CBC
HCT: 40.7 % (ref 35.0–45.0)
Hemoglobin: 12.6 g/dL (ref 11.7–15.5)
MCH: 24.8 pg — ABNORMAL LOW (ref 27.0–33.0)
MCHC: 31 g/dL — ABNORMAL LOW (ref 32.0–36.0)
MCV: 80.1 fL (ref 80.0–100.0)
MPV: 11.6 fL (ref 7.5–12.5)
Platelets: 309 10*3/uL (ref 140–400)
RBC: 5.08 10*6/uL (ref 3.80–5.10)
RDW: 14.7 % (ref 11.0–15.0)
WBC: 7.5 10*3/uL (ref 3.8–10.8)

## 2021-05-16 LAB — AFP TUMOR MARKER: AFP-Tumor Marker: 4.9 ng/mL

## 2021-05-16 LAB — COMPREHENSIVE METABOLIC PANEL
AG Ratio: 1.6 (calc) (ref 1.0–2.5)
ALT: 12 U/L (ref 6–29)
AST: 13 U/L (ref 10–35)
Albumin: 3.9 g/dL (ref 3.6–5.1)
Alkaline phosphatase (APISO): 79 U/L (ref 37–153)
BUN: 12 mg/dL (ref 7–25)
CO2: 29 mmol/L (ref 20–32)
Calcium: 9.5 mg/dL (ref 8.6–10.4)
Chloride: 105 mmol/L (ref 98–110)
Creat: 0.75 mg/dL (ref 0.60–1.00)
Globulin: 2.4 g/dL (calc) (ref 1.9–3.7)
Glucose, Bld: 91 mg/dL (ref 65–99)
Potassium: 4.4 mmol/L (ref 3.5–5.3)
Sodium: 142 mmol/L (ref 135–146)
Total Bilirubin: 0.5 mg/dL (ref 0.2–1.2)
Total Protein: 6.3 g/dL (ref 6.1–8.1)

## 2021-05-16 LAB — PROTIME-INR
INR: 1
Prothrombin Time: 9.8 s (ref 9.0–11.5)

## 2021-05-27 ENCOUNTER — Ambulatory Visit (INDEPENDENT_AMBULATORY_CARE_PROVIDER_SITE_OTHER): Payer: Medicare Other | Admitting: Gastroenterology

## 2021-05-28 ENCOUNTER — Ambulatory Visit (HOSPITAL_COMMUNITY)
Admission: RE | Admit: 2021-05-28 | Discharge: 2021-05-28 | Disposition: A | Discharge status: Home / Self Care | Payer: Medicare Other | Source: Ambulatory Visit | Attending: Gastroenterology | Admitting: Gastroenterology

## 2021-05-28 ENCOUNTER — Other Ambulatory Visit: Payer: Self-pay

## 2021-05-28 DIAGNOSIS — K7460 Unspecified cirrhosis of liver: Secondary | ICD-10-CM | POA: Diagnosis not present

## 2021-05-30 ENCOUNTER — Encounter (INDEPENDENT_AMBULATORY_CARE_PROVIDER_SITE_OTHER): Payer: Self-pay

## 2021-06-05 DIAGNOSIS — L820 Inflamed seborrheic keratosis: Secondary | ICD-10-CM | POA: Diagnosis not present

## 2021-06-05 DIAGNOSIS — L821 Other seborrheic keratosis: Secondary | ICD-10-CM | POA: Diagnosis not present

## 2021-06-26 DIAGNOSIS — H2513 Age-related nuclear cataract, bilateral: Secondary | ICD-10-CM | POA: Diagnosis not present

## 2021-06-26 DIAGNOSIS — H40033 Anatomical narrow angle, bilateral: Secondary | ICD-10-CM | POA: Diagnosis not present

## 2021-07-29 DIAGNOSIS — G2581 Restless legs syndrome: Secondary | ICD-10-CM | POA: Diagnosis not present

## 2021-07-29 DIAGNOSIS — R609 Edema, unspecified: Secondary | ICD-10-CM | POA: Diagnosis not present

## 2021-07-29 DIAGNOSIS — F17200 Nicotine dependence, unspecified, uncomplicated: Secondary | ICD-10-CM | POA: Diagnosis not present

## 2021-07-29 DIAGNOSIS — J441 Chronic obstructive pulmonary disease with (acute) exacerbation: Secondary | ICD-10-CM | POA: Diagnosis not present

## 2021-07-29 DIAGNOSIS — R599 Enlarged lymph nodes, unspecified: Secondary | ICD-10-CM | POA: Diagnosis not present

## 2021-07-29 DIAGNOSIS — E041 Nontoxic single thyroid nodule: Secondary | ICD-10-CM | POA: Diagnosis not present

## 2021-07-29 DIAGNOSIS — K219 Gastro-esophageal reflux disease without esophagitis: Secondary | ICD-10-CM | POA: Diagnosis not present

## 2021-08-07 DIAGNOSIS — R599 Enlarged lymph nodes, unspecified: Secondary | ICD-10-CM | POA: Diagnosis not present

## 2021-08-07 DIAGNOSIS — J441 Chronic obstructive pulmonary disease with (acute) exacerbation: Secondary | ICD-10-CM | POA: Diagnosis not present

## 2021-08-07 DIAGNOSIS — F17200 Nicotine dependence, unspecified, uncomplicated: Secondary | ICD-10-CM | POA: Diagnosis not present

## 2021-08-07 DIAGNOSIS — R609 Edema, unspecified: Secondary | ICD-10-CM | POA: Diagnosis not present

## 2021-08-07 DIAGNOSIS — E041 Nontoxic single thyroid nodule: Secondary | ICD-10-CM | POA: Diagnosis not present

## 2021-08-07 DIAGNOSIS — G2581 Restless legs syndrome: Secondary | ICD-10-CM | POA: Diagnosis not present

## 2021-08-07 DIAGNOSIS — K219 Gastro-esophageal reflux disease without esophagitis: Secondary | ICD-10-CM | POA: Diagnosis not present

## 2021-08-14 DIAGNOSIS — G2581 Restless legs syndrome: Secondary | ICD-10-CM | POA: Diagnosis not present

## 2021-08-14 DIAGNOSIS — J441 Chronic obstructive pulmonary disease with (acute) exacerbation: Secondary | ICD-10-CM | POA: Diagnosis not present

## 2021-08-14 DIAGNOSIS — R599 Enlarged lymph nodes, unspecified: Secondary | ICD-10-CM | POA: Diagnosis not present

## 2021-08-14 DIAGNOSIS — R609 Edema, unspecified: Secondary | ICD-10-CM | POA: Diagnosis not present

## 2021-08-14 DIAGNOSIS — F17200 Nicotine dependence, unspecified, uncomplicated: Secondary | ICD-10-CM | POA: Diagnosis not present

## 2021-08-14 DIAGNOSIS — K219 Gastro-esophageal reflux disease without esophagitis: Secondary | ICD-10-CM | POA: Diagnosis not present

## 2021-08-14 DIAGNOSIS — E041 Nontoxic single thyroid nodule: Secondary | ICD-10-CM | POA: Diagnosis not present

## 2021-09-16 DIAGNOSIS — M25562 Pain in left knee: Secondary | ICD-10-CM | POA: Diagnosis not present

## 2021-09-16 DIAGNOSIS — S83241D Other tear of medial meniscus, current injury, right knee, subsequent encounter: Secondary | ICD-10-CM | POA: Diagnosis not present

## 2021-10-13 DIAGNOSIS — I10 Essential (primary) hypertension: Secondary | ICD-10-CM | POA: Diagnosis not present

## 2021-10-13 DIAGNOSIS — G2581 Restless legs syndrome: Secondary | ICD-10-CM | POA: Diagnosis not present

## 2021-10-13 DIAGNOSIS — K219 Gastro-esophageal reflux disease without esophagitis: Secondary | ICD-10-CM | POA: Diagnosis not present

## 2021-10-13 DIAGNOSIS — R599 Enlarged lymph nodes, unspecified: Secondary | ICD-10-CM | POA: Diagnosis not present

## 2021-10-13 DIAGNOSIS — F17200 Nicotine dependence, unspecified, uncomplicated: Secondary | ICD-10-CM | POA: Diagnosis not present

## 2021-10-13 DIAGNOSIS — Z23 Encounter for immunization: Secondary | ICD-10-CM | POA: Diagnosis not present

## 2021-10-13 DIAGNOSIS — E041 Nontoxic single thyroid nodule: Secondary | ICD-10-CM | POA: Diagnosis not present

## 2021-10-13 DIAGNOSIS — J441 Chronic obstructive pulmonary disease with (acute) exacerbation: Secondary | ICD-10-CM | POA: Diagnosis not present

## 2021-10-22 ENCOUNTER — Encounter (INDEPENDENT_AMBULATORY_CARE_PROVIDER_SITE_OTHER): Payer: Self-pay | Admitting: Gastroenterology

## 2021-11-11 ENCOUNTER — Ambulatory Visit (INDEPENDENT_AMBULATORY_CARE_PROVIDER_SITE_OTHER): Payer: Medicare Other | Admitting: Internal Medicine

## 2021-11-17 DIAGNOSIS — L820 Inflamed seborrheic keratosis: Secondary | ICD-10-CM | POA: Diagnosis not present

## 2021-12-09 ENCOUNTER — Ambulatory Visit (INDEPENDENT_AMBULATORY_CARE_PROVIDER_SITE_OTHER): Payer: Medicare Other | Admitting: Gastroenterology

## 2021-12-15 DIAGNOSIS — M8589 Other specified disorders of bone density and structure, multiple sites: Secondary | ICD-10-CM | POA: Diagnosis not present

## 2021-12-15 DIAGNOSIS — M810 Age-related osteoporosis without current pathological fracture: Secondary | ICD-10-CM | POA: Diagnosis not present

## 2021-12-17 ENCOUNTER — Other Ambulatory Visit: Payer: Self-pay | Admitting: Urology

## 2021-12-17 ENCOUNTER — Other Ambulatory Visit (HOSPITAL_COMMUNITY): Payer: Self-pay | Admitting: Urology

## 2021-12-17 DIAGNOSIS — N281 Cyst of kidney, acquired: Secondary | ICD-10-CM

## 2022-01-06 ENCOUNTER — Ambulatory Visit (INDEPENDENT_AMBULATORY_CARE_PROVIDER_SITE_OTHER): Payer: Medicare Other | Admitting: Gastroenterology

## 2022-01-06 ENCOUNTER — Encounter (INDEPENDENT_AMBULATORY_CARE_PROVIDER_SITE_OTHER): Payer: Self-pay | Admitting: Gastroenterology

## 2022-01-06 VITALS — BP 90/58 | HR 93 | Temp 98.0°F | Ht 61.5 in | Wt 194.6 lb

## 2022-01-06 DIAGNOSIS — R195 Other fecal abnormalities: Secondary | ICD-10-CM | POA: Diagnosis not present

## 2022-01-06 DIAGNOSIS — K7581 Nonalcoholic steatohepatitis (NASH): Secondary | ICD-10-CM | POA: Diagnosis not present

## 2022-01-06 DIAGNOSIS — R152 Fecal urgency: Secondary | ICD-10-CM

## 2022-01-06 MED ORDER — DICYCLOMINE HCL 10 MG PO CAPS: 10.0000 mg | ORAL_CAPSULE | Freq: Two times a day (BID) | ORAL | 2 refills | Status: DC | PRN

## 2022-01-06 NOTE — Progress Notes (Unsigned)
Referring Provider: [place] Nation, MD Primary Care Physician:  [place] Nation, MD Primary GI Physician:   Chief Complaint  Patient presents with   Cirrhosis    6 month follow up on cirrhosis. Has concerns about bloating and has to have BM after eating. States stool is soft like a cow patty.    HPI:   Jill Braun is a 76 y.o. female with past medical history of COPD, GERD, HLD, middle cerebral artery aneurysm, OA of L knee.  Patient presenting today for follow up NASH  Last seen 05/15/21, at that time patient here for follow up of fatty liver, Patient had CT Abdomen wo contrast in aug 2022 with findings to suggest slight nodularity of hepatic contour with widening of the hepatic fissures, possibly reflecting cirrhosis. No suspicious hepatic lesions. No recent labs available for review. She denied any ascites.  had some edema to her L leg in the past and was put on diuretics but no longer on these. She is no longer noticing LE edema.no episodes of confusion. She denied rectal bleeding or melena. taking miralax for her constipation as needed, having one BM everyday.    [place] elastography, CMP CBC, INR and AFP done, advised to start mediterranean diet.  Elastography consistent with NASH/possibly very mild cirrhosis.  AFP 4.9, AST 13, ALT 12, plt count 309k  Advised to have repeat liver imaging and AFP in Nov 2023 as long as labs remain stable   Present:  Patient reporting intermittent softer stools, usually occurs pretty quickly after eating. She is wearing depends as she has fecal urgency during these times. She describes her stools as a "cow patty." She denies any nocturnal fecal incontinence. Denies watery stools. She reports that this occurs a few times per month. She has soft but more formed stools other times, on average having a BM maybe 3-4x/week. She denies rectal bleeding or melena. Denies abdominal pain. She states since the gallbladder removal a few years ago, she has had  issues with this. She does not eat a lot of greasy/fatty foods, mostly does baked or roasted stuff. She does eat quite a bit of fiber in her diet. Was on bentyl in the past with good results. Has never been on any medication for BAD.   She is having a CT A/P w wo contrast in August for lesions on her kidney.   She is having some swelling to her abdomen and her lower legs. She has lasix '20mg'$  that she takes as needed but tends to have a lot of cramping when she takes this. She does take potassium and magnesium at the time of her diuretics. She denies any jaundice or itching. She has upcoming labs with Dr. Jimmye Norman on Thursday. Denies any episodes of confusion.  Weight is up about 8 pounds since last visit. She has not started the [employer]. She tries to exercise as she can but has a lot of issues with her knees. She was previously very active prior to this. Tells me her brother recently passed in June and this has been very hard on her.   Last Colonoscopy:08/29/20- Perianal skin tags found on perianal exam. - Diverticulosis in the sigmoid colon and in the ascending colon. - Three 4 to 7 mm polyps in the cecum, removed with a cold snare. Proximal margin of polyp next to ileocecal valve was ablated with APC. Sessile serrated - One small polyp in the cecum. Tubular adenoma - Six 4 to 6 mm polyps at  the splenic flexure, in the transverse colon and at the hepatic flexure, hyperplastic polyp - Nine 4 to 8 mm polyps in the rectum and in the sigmoid colon, hyperplastic polyp - External and internal hemorrhoids. - Anal papilla(e) were hypertrophied. Last Endoscopy:n/a   Recommendations:  Repeat colonoscopy 3 years   Past Medical History:  Diagnosis Date   Cirrhosis ([place])    COPD (chronic obstructive pulmonary disease) ([place]) 06/02/2016   Food poisoning 2019   AT [facility]   GERD (gastroesophageal reflux disease)    Headache    History of cardiac catheterization    Normal coronary  arteries 2009   Hyperlipidemia    Middle cerebral artery aneurysm    Left - [facility] 2003   Primary localized osteoarthritis of left knee 03/08/2019    Past Surgical History:  Procedure Laterality Date   ABDOMINAL HYSTERECTOMY     1 OVARY LEFT   BIOPSY  07/02/2017   Procedure: BIOPSY;  Surgeon: Rogene Houston, MD;  Location[facility];  Service: Endoscopy;;  colon   [place] EXTRACTION W/PHACO Right 02/01/2014   Procedure: CATARACT EXTRACTION PHACO AND INTRAOCULAR LENS PLACEMENT ([place]);  Surgeon: Tonny Branch, MD;  Location: [facility] ORS;  Service: Ophthalmology;  Laterality: Right;  CDE 7.59   CATARACT EXTRACTION W/PHACO Left 02/22/2014   Procedure: CATARACT EXTRACTION PHACO AND INTRAOCULAR LENS PLACEMENT (IOC);  Surgeon: Tonny Branch, MD;  Location: [facility] ORS;  Service: Ophthalmology;  Laterality: Left;  CDE 8.12   Cerebral aneurysm surgery     2003 at [place]   COLONOSCOPY N/A 07/02/2017   Procedure: COLONOSCOPY;  Surgeon: Rogene Houston, MD;  Location: [facility];  Service: Endoscopy;  Laterality: N/A;  7:30   COLONOSCOPY N/A 08/29/2020   Procedure: COLONOSCOPY;  Surgeon: Rogene Houston, MD;  Location: [facility];  Service: Endoscopy;  Laterality: N/A;  8:15   POLYPECTOMY  07/02/2017   Procedure: POLYPECTOMY;  Surgeon: Rogene Houston, MD;  Location: [facility];  Service: Endoscopy;;  colon   POLYPECTOMY  08/29/2020   Procedure: POLYPECTOMY;  Surgeon: Rogene Houston, MD;  Location: [facility];  Service: Endoscopy;;   TOTAL KNEE ARTHROPLASTY Left 03/20/2019   Procedure: TOTAL KNEE ARTHROPLASTY;  Surgeon: Elsie Saas, MD;  Location: [facility] ORS;  Service: Orthopedics;  Laterality: Left;    Current Outpatient Medications  Medication Sig Dispense Refill   acetaminophen (TYLENOL) 500 MG tablet Take 500 mg by mouth every 6 (six) hours as needed for moderate pain or headache.     albuterol (ACCUNEB) 1.25 MG/3ML nebulizer  solution Take 1 ampule by nebulization every 6 (six) hours as needed for wheezing.     Albuterol Sulfate 108 (90 Base) MCG/ACT AEPB Inhale 1 puff into the lungs every 6 (six) hours as needed (for shortness of breath/wheezing).      Cholecalciferol (VITAMIN D) 125 MCG (5000 UT) CAPS Take by mouth. One daily     citalopram (CELEXA) 20 MG tablet Take 20 mg by mouth daily.     Fluticasone-Umeclidin-Vilant (TRELEGY ELLIPTA) 100-62.5-25 MCG/INH AEPB Inhale 1 puff into the lungs daily as needed (shortness of breath).     Multiple Vitamin (MULTIVITAMIN) tablet Take 1 tablet by mouth daily.     pantoprazole (PROTONIX) 40 MG tablet Take 40 mg by mouth daily. qam     No current facility-administered medications for this visit.    Allergies as of 01/06/2022   (  No Known Allergies)    Family History  Problem Relation Age of Onset   Breast cancer Mother    Heart attack Mother    Heart attack Father    Diabetes Mellitus II Father    Colon cancer Neg Hx     Social History   Socioeconomic History   Marital status: Widowed    Spouse name: Not on file   Number of children: Not on file   Years of education: Not on file   Highest education level: Not on file  Occupational History   Not on file  Tobacco Use   Smoking status: Some Days    Packs/day: 2.00    Years: 47.00    Total pack years: 94.00    Types: Cigarettes    Passive exposure: Current   Smokeless tobacco: Never  Vaping Use   Vaping Use: Never used  Substance and Sexual Activity   Alcohol use: No   Drug use: No   Sexual activity: Yes  Other Topics Concern   Not on file  Social History Narrative   Not on file   Social Determinants of Health   Financial Resource Strain: Not on file  Food Insecurity: Not on file  Transportation Needs: Not on file  Physical Activity: Not on file  Stress: Not on file  Social Connections: Not on file   Review of systems General: negative for malaise, night sweats, fever, chills, weight  loss Neck: Negative for lumps, goiter, pain and significant neck swelling Resp: Negative for cough, wheezing, dyspnea at rest CV: Negative for chest pain, leg swelling, palpitations, orthopnea GI: denies melena, hematochezia, nausea, vomiting, constipation, dysphagia, odyonophagia, early satiety or unintentional weight loss. +looser stools +fecal urgency MSK: Negative for joint pain or swelling, back pain, and muscle pain. Derm: Negative for itching or rash Psych: Denies depression, anxiety, memory loss, confusion. No homicidal or suicidal ideation.  Heme: Negative for prolonged bleeding, bruising easily, and swollen nodes. Endocrine: Negative for cold or heat intolerance, polyuria, polydipsia and goiter. Neuro: negative for tremor, gait imbalance, syncope and seizures. The remainder of the review of systems is noncontributory.  Physical Exam: BP (!) 90/58 (BP Location: Left Arm, Patient Position: Sitting, Cuff Size: Large)   Pulse 93   Temp 98 F (36.7 C) (Oral)   Ht 5' 1.5" (1.562 m)   Wt 194 lb 9.6 oz (88.3 kg)   BMI 36.17 kg/m  General:   Alert and oriented. No distress noted. Pleasant and cooperative.  Head:  Normocephalic and atraumatic. Eyes:  Conjuctiva clear without scleral icterus. Mouth:  Oral mucosa pink and moist. Good dentition. No lesions. Heart: Normal rate and rhythm, s1 and s2 heart sounds present.  Lungs: Clear lung sounds in all lobes. Respirations equal and unlabored. Abdomen:  +BS, soft, non-tender. Abdomen full but soft, non taut. No rebound or guarding. No HSM or masses noted. Derm: No palmar erythema or jaundice Msk:  Symmetrical without gross deformities. Normal posture. Extremities:  Without edema. Neurologic:  Alert and  oriented x4 Psych:  Alert and cooperative. Normal mood and affect.  Invalid input(s): "6 MONTHS"   ASSESSMENT: Jill Braun is a 76 y.o. female presenting today for follow up of NASH.  Patient seen previously after CT A/P concerning  for hepatic cirrhosis. [place] elastography, CMP CBC, INR and AFP done, advised to start mediterranean diet. Elastography consistent with NASH/possibly very mild cirrhosis.  AFP 4.9, AST 13, ALT 12, plt count 309k. Advised to have repeat liver imaging and AFP  in one year as long as labs remain stable. She is having labs done with PCP on Thursday and will have these forwarded to [place]. She will be due for repeat AFP in November. She denies any jaundice, confusion, or itching. Encouraged her again to start mediterranean diet and exercise as much as possible given presence of NASH.   In regards to looser stools and fecal urgency, this occurs a few times per month and has on and off since cholecystectomy in 1995. She has no rectal bleeding, melena, abdominal pain or weight loss. Has been on bentyl in the past with good results. Query some aspect of BAD, though she denies diet high in greasy/fatty foods. Will try bentyl '10mg'$  BID PRN and low FODMAP diet. If she has not improvement with these, will consider trial of colestid.   No red flag symptoms. Patient denies melena, hematochezia, nausea, vomiting, constipation, dysphagia, odyonophagia, early satiety or weight loss.    PLAN:  Repeat US of liver in Nov 2023 2. Repeat AFP nov 2023 3. mediterranean diet 4. Exercise as much as possible 5. Rx bentyl '10mg'$  BID PRN, consider Colestid if no improvement with this 6. Low FODMAP diet, food journal 7. Have PCP send over labs later this week   All questions were answered, patient verbalized understanding and is in agreement with plan as outlined above.    Follow Up: 6 months   Taevion Sikora L. Alver Sorrow, MSN, APRN, AGNP-C Adult-Gerontology Nurse Practitioner [facility]

## 2022-01-06 NOTE — Patient Instructions (Signed)
In regards to your looser stools and fecal urgency, we can try bentyl '10mg'$  twice a day as needed since this worked well for you in the past, if you are not having good results with this, please let me know as there are other options we can try I am also providing the low FODMAP diet as this lists common triggers of looser stools, it may be helpful to keep track of what you eat and what symptoms you have for the next few weeks.  Please have Dr. Jimmye Norman forward your labs later this week  We will likely repeat liver US in November since you are having a CT in august for your kidneys, I will try to keep an eye out for the results of this for further determination.  Will plan to repeat liver tumor marker again in November as well.   Follow up jan 2024

## 2022-01-09 DIAGNOSIS — K219 Gastro-esophageal reflux disease without esophagitis: Secondary | ICD-10-CM | POA: Diagnosis not present

## 2022-01-09 DIAGNOSIS — J449 Chronic obstructive pulmonary disease, unspecified: Secondary | ICD-10-CM | POA: Diagnosis not present

## 2022-01-09 DIAGNOSIS — E041 Nontoxic single thyroid nodule: Secondary | ICD-10-CM | POA: Diagnosis not present

## 2022-01-09 DIAGNOSIS — R7309 Other abnormal glucose: Secondary | ICD-10-CM | POA: Diagnosis not present

## 2022-01-09 DIAGNOSIS — E785 Hyperlipidemia, unspecified: Secondary | ICD-10-CM | POA: Diagnosis not present

## 2022-01-09 DIAGNOSIS — E559 Vitamin D deficiency, unspecified: Secondary | ICD-10-CM | POA: Diagnosis not present

## 2022-01-09 DIAGNOSIS — N289 Disorder of kidney and ureter, unspecified: Secondary | ICD-10-CM | POA: Diagnosis not present

## 2022-01-12 DIAGNOSIS — R599 Enlarged lymph nodes, unspecified: Secondary | ICD-10-CM | POA: Diagnosis not present

## 2022-01-12 DIAGNOSIS — K219 Gastro-esophageal reflux disease without esophagitis: Secondary | ICD-10-CM | POA: Diagnosis not present

## 2022-01-12 DIAGNOSIS — J449 Chronic obstructive pulmonary disease, unspecified: Secondary | ICD-10-CM | POA: Diagnosis not present

## 2022-01-12 DIAGNOSIS — E041 Nontoxic single thyroid nodule: Secondary | ICD-10-CM | POA: Diagnosis not present

## 2022-01-12 DIAGNOSIS — R030 Elevated blood-pressure reading, without diagnosis of hypertension: Secondary | ICD-10-CM | POA: Diagnosis not present

## 2022-01-12 DIAGNOSIS — G2581 Restless legs syndrome: Secondary | ICD-10-CM | POA: Diagnosis not present

## 2022-01-12 DIAGNOSIS — F17200 Nicotine dependence, unspecified, uncomplicated: Secondary | ICD-10-CM | POA: Diagnosis not present

## 2022-01-15 DIAGNOSIS — E039 Hypothyroidism, unspecified: Secondary | ICD-10-CM | POA: Diagnosis not present

## 2022-01-15 DIAGNOSIS — E041 Nontoxic single thyroid nodule: Secondary | ICD-10-CM | POA: Diagnosis not present

## 2022-01-26 DIAGNOSIS — Z1231 Encounter for screening mammogram for malignant neoplasm of breast: Secondary | ICD-10-CM | POA: Diagnosis not present

## 2022-01-28 ENCOUNTER — Ambulatory Visit (HOSPITAL_COMMUNITY)
Admission: RE | Admit: 2022-01-28 | Discharge: 2022-01-28 | Disposition: A | Discharge status: Home / Self Care | Payer: Medicare Other | Source: Ambulatory Visit | Attending: Urology | Admitting: Urology

## 2022-01-28 DIAGNOSIS — N281 Cyst of kidney, acquired: Secondary | ICD-10-CM | POA: Diagnosis not present

## 2022-01-28 DIAGNOSIS — K5730 Diverticulosis of large intestine without perforation or abscess without bleeding: Secondary | ICD-10-CM | POA: Diagnosis not present

## 2022-01-28 DIAGNOSIS — N2889 Other specified disorders of kidney and ureter: Secondary | ICD-10-CM | POA: Diagnosis not present

## 2022-01-28 LAB — POCT I-STAT CREATININE: Creatinine, Ser: 0.8 mg/dL (ref 0.44–1.00)

## 2022-01-28 MED ORDER — IOHEXOL 300 MG/ML  SOLN
100.0000 mL | Freq: Once | INTRAMUSCULAR | Status: AC | PRN
  Administered 2022-01-28: 100 mL via INTRAVENOUS

## 2022-01-29 DIAGNOSIS — F17210 Nicotine dependence, cigarettes, uncomplicated: Secondary | ICD-10-CM | POA: Diagnosis not present

## 2022-01-29 DIAGNOSIS — M79604 Pain in right leg: Secondary | ICD-10-CM | POA: Diagnosis not present

## 2022-01-29 DIAGNOSIS — M79605 Pain in left leg: Secondary | ICD-10-CM | POA: Diagnosis not present

## 2022-02-04 DIAGNOSIS — N281 Cyst of kidney, acquired: Secondary | ICD-10-CM | POA: Diagnosis not present

## 2022-02-09 DIAGNOSIS — Z0001 Encounter for general adult medical examination with abnormal findings: Secondary | ICD-10-CM | POA: Diagnosis not present

## 2022-02-09 DIAGNOSIS — E039 Hypothyroidism, unspecified: Secondary | ICD-10-CM | POA: Diagnosis not present

## 2022-03-31 DIAGNOSIS — M7122 Synovial cyst of popliteal space [Baker], left knee: Secondary | ICD-10-CM | POA: Diagnosis not present

## 2022-03-31 DIAGNOSIS — M7121 Synovial cyst of popliteal space [Baker], right knee: Secondary | ICD-10-CM | POA: Diagnosis not present

## 2022-04-13 DIAGNOSIS — M810 Age-related osteoporosis without current pathological fracture: Secondary | ICD-10-CM | POA: Diagnosis not present

## 2022-04-13 DIAGNOSIS — G2581 Restless legs syndrome: Secondary | ICD-10-CM | POA: Diagnosis not present

## 2022-04-13 DIAGNOSIS — K219 Gastro-esophageal reflux disease without esophagitis: Secondary | ICD-10-CM | POA: Diagnosis not present

## 2022-04-13 DIAGNOSIS — J441 Chronic obstructive pulmonary disease with (acute) exacerbation: Secondary | ICD-10-CM | POA: Diagnosis not present

## 2022-04-13 DIAGNOSIS — E041 Nontoxic single thyroid nodule: Secondary | ICD-10-CM | POA: Diagnosis not present

## 2022-04-13 DIAGNOSIS — F17200 Nicotine dependence, unspecified, uncomplicated: Secondary | ICD-10-CM | POA: Diagnosis not present

## 2022-04-13 DIAGNOSIS — R599 Enlarged lymph nodes, unspecified: Secondary | ICD-10-CM | POA: Diagnosis not present

## 2022-04-16 DIAGNOSIS — M7121 Synovial cyst of popliteal space [Baker], right knee: Secondary | ICD-10-CM | POA: Diagnosis not present

## 2022-04-16 DIAGNOSIS — M7122 Synovial cyst of popliteal space [Baker], left knee: Secondary | ICD-10-CM | POA: Diagnosis not present

## 2022-04-21 DIAGNOSIS — M810 Age-related osteoporosis without current pathological fracture: Secondary | ICD-10-CM | POA: Diagnosis not present

## 2022-05-14 DIAGNOSIS — X32XXXD Exposure to sunlight, subsequent encounter: Secondary | ICD-10-CM | POA: Diagnosis not present

## 2022-05-14 DIAGNOSIS — B078 Other viral warts: Secondary | ICD-10-CM | POA: Diagnosis not present

## 2022-05-14 DIAGNOSIS — L570 Actinic keratosis: Secondary | ICD-10-CM | POA: Diagnosis not present

## 2022-05-15 ENCOUNTER — Telehealth (INDEPENDENT_AMBULATORY_CARE_PROVIDER_SITE_OTHER): Payer: Self-pay | Admitting: *Deleted

## 2022-05-15 NOTE — Telephone Encounter (Signed)
Patient is on recall for 1 yr CT A/P per your recommendation from her [place] in 05/2021, it looks like she had a CT A/P in Aug 2023 by Dr Rexene Alberts, does she still need CT from our recall, please advise

## 2022-05-18 ENCOUNTER — Encounter (INDEPENDENT_AMBULATORY_CARE_PROVIDER_SITE_OTHER): Payer: Self-pay | Admitting: *Deleted

## 2022-05-18 NOTE — Telephone Encounter (Signed)
[place] recall letter mailed to patient

## 2022-05-25 ENCOUNTER — Telehealth (INDEPENDENT_AMBULATORY_CARE_PROVIDER_SITE_OTHER): Payer: Self-pay | Admitting: *Deleted

## 2022-05-25 NOTE — Telephone Encounter (Signed)
Per [place] - AFP to be done in 1 year.( Due Around 06/04/22) 11/27 I called patient and she does not thinks she can come to [place] in December and wants to do labs when she comes for OV in January.

## 2022-07-09 ENCOUNTER — Ambulatory Visit (INDEPENDENT_AMBULATORY_CARE_PROVIDER_SITE_OTHER): Payer: Medicare Other | Admitting: Gastroenterology

## 2022-07-23 DIAGNOSIS — F17200 Nicotine dependence, unspecified, uncomplicated: Secondary | ICD-10-CM | POA: Diagnosis not present

## 2022-07-23 DIAGNOSIS — R030 Elevated blood-pressure reading, without diagnosis of hypertension: Secondary | ICD-10-CM | POA: Diagnosis not present

## 2022-07-23 DIAGNOSIS — J441 Chronic obstructive pulmonary disease with (acute) exacerbation: Secondary | ICD-10-CM | POA: Diagnosis not present

## 2022-07-30 DIAGNOSIS — F17200 Nicotine dependence, unspecified, uncomplicated: Secondary | ICD-10-CM | POA: Diagnosis not present

## 2022-07-30 DIAGNOSIS — J441 Chronic obstructive pulmonary disease with (acute) exacerbation: Secondary | ICD-10-CM | POA: Diagnosis not present

## 2022-08-03 DIAGNOSIS — J441 Chronic obstructive pulmonary disease with (acute) exacerbation: Secondary | ICD-10-CM | POA: Diagnosis not present

## 2022-08-03 DIAGNOSIS — F17200 Nicotine dependence, unspecified, uncomplicated: Secondary | ICD-10-CM | POA: Diagnosis not present

## 2022-08-17 DIAGNOSIS — J441 Chronic obstructive pulmonary disease with (acute) exacerbation: Secondary | ICD-10-CM | POA: Diagnosis not present

## 2022-08-17 DIAGNOSIS — F17200 Nicotine dependence, unspecified, uncomplicated: Secondary | ICD-10-CM | POA: Diagnosis not present

## 2022-08-26 NOTE — Progress Notes (Signed)
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 Manuella Ghazi. Pt c/o DOE x 5 months- started after exposed to paint fumes. Sometimes gets winded just walking room to room. Also c/o cough- non prod.   Dyspnea:  Room to room s assoc cp  But  audible wheeze min better p saba hfa / neb  Cough: when get hot  Sleep: flat 2 pillows, wakes up after an hour or two smothering and sets up  Prednisone not much better, gained 30 lb  Rec Plan A = Automatic = Always=    Breztri one twice daily immediately when get up and 12 hours later  Work on inhaler technique:   Plan B = Backup (to supplement plan A, not to replace it) Only use your albuterol inhaler as a rescue medication  Plan C = Crisis (instead of Plan B but only if Plan B stops working) - only use your albuterol nebulizer if you first try Plan B and it fails to help  Try albuterol 15 min before an activity that you know would make you short of breath and see if it makes any difference  Please remember to go to the lab department  [place]  for your tests - we will call you with the results when they are available.      Please schedule a follow up office visit in 4 weeks, sooner if needed    08/27/2022  Re-establish ov/[place] office/Paityn Balsam re: AB maint on trelegy 100 one daily  and last prednisone 3 weeks prior to [place]  Patient presents with   Consult    Pneumonia 4 weeks ago  Last seen 2021 for COPD cough and DOE  Stopped smoking 3 weeks ago   Dyspnea:  limited by L knee than breathing  Cough: slt yellow worst first thing in am Sleeping: 45 degree recliner  SABA use: not needing much/ neb once or twice a week  02: none  Covid status: x  4 vax  Lung cancer screening: at [facility]    No obvious day to day or daytime variability or assoc mucus plugs or hemoptysis or cp or chest tightness, subjective wheeze or overt sinus or hb symptoms.   Sleeping  without nocturnal  or early am exacerbation  of respiratory  c/o's or need for noct saba. Also denies any obvious fluctuation of symptoms with weather or environmental changes or other aggravating or alleviating factors except as outlined above   No unusual exposure hx or h/o childhood pna/ asthma or knowledge of premature birth.  Current Allergies, Complete Past Medical History, Past Surgical History, Family History, and Social History were reviewed in [employer] record.  ROS  The following are not active complaints unless bolded Hoarseness,  sore throat, dysphagia, dental problems, itching, sneezing,  nasal congestion or discharge of excess mucus or purulent secretions, ear ache,   fever, chills, sweats, unintended wt loss or wt gain, classically pleuritic or exertional cp,  orthopnea pnd or arm/hand swelling  or leg swelling, presyncope, palpitations, abdominal pain, anorexia, nausea, vomiting, diarrhea  or change in bowel habits or change in bladder habits, change in stools or change in urine, dysuria, hematuria,  rash, arthralgias, visual complaints, headache, numbness, weakness or ataxia or problems with walking or coordination,  change in mood or  memory.        Current Meds  Medication Sig   acetaminophen (TYLENOL) 500 MG tablet Take 500 mg by mouth every 6 (six) hours as needed for moderate pain or headache.    albuterol (ACCUNEB) 1.25 MG/3ML nebulizer solution Take 1 ampule by nebulization every 6 (six) hours as needed for wheezing.   Albuterol Sulfate 108 (90 Base) MCG/ACT AEPB Inhale 1 puff into the lungs every 6 (six) hours as needed (for shortness of breath/wheezing).    Cholecalciferol (VITAMIN D) 125 MCG (5000 UT) CAPS Take by mouth. One daily   citalopram (CELEXA) 20 MG tablet Take 20 mg by mouth daily.   dicyclomine (BENTYL) 10 MG capsule Take 1 capsule (10 mg total) by mouth 2 (two) times daily as needed for spasms.   Fluticasone-Umeclidin-Vilant (TRELEGY ELLIPTA) 100-62.5-25 MCG/INH AEPB Inhale 1 puff into the lungs daily as needed (shortness of breath).   Multiple Vitamin (MULTIVITAMIN) tablet Take 1 tablet by mouth daily.   pantoprazole (PROTONIX) 40 MG tablet Take 40 mg by mouth daily. qam               Past Medical History:  Diagnosis Date   COPD (chronic obstructive pulmonary disease) ([place]) 06/02/2016   Food poisoning 2019   AT [facility]   GERD (gastroesophageal reflux disease)    Headache    History of cardiac catheterization    Normal coronary arteries 2009   Hyperlipidemia    Middle cerebral artery aneurysm    Left - [facility] 2003   Primary localized osteoarthritis of left knee 03/08/2019       Objective:     Wt Readings from Last 3 Encounters:  08/27/22 191 lb 3.2 oz (86.7 kg)  01/06/22 194 lb 9.6 oz (88.3 kg)  05/15/21 186 lb 3.4 oz (84.5 kg)      Vital signs reviewed  08/27/2022  - Note at rest 02 sats  94% on RA   General appearance:    amb slt hoarse wf nad     HEENT : Oropharynx clear/ edentulous     NECK :  without  apparent JVD/ palpable Nodes/TM    LUNGS: no acc muscle use,  Mild barrel  contour chest wall with bilateral  Distant bs s audible wheeze and  without cough on insp or exp maneuvers  and mild  Hyperresonant  to  percussion bilaterally     CV:  RRR  no s3 or murmur or increase in P2, and no edema   ABD: obese  soft and nontender  with pos end  insp Hoover's  in the supine position.  No bruits or organomegaly appreciated   MS:  Nl gait/ ext warm without deformities Or obvious joint restrictions  calf tenderness, cyanosis or clubbing     SKIN: warm and dry without lesions    NEURO:  alert, approp, nl sensorium with  no motor or cerebellar deficits apparent.  I personally reviewed images and agree with radiology impression as follows:   Chest LDSCT '@[facility]'$    01/29/22  Mild centrilobular emphysema with diffuse bronchial wall thickening    CXR PA and Lateral:   08/27/2022 :    I personally reviewed images and impression is as follows:      Mild CM/ mild T kyphosis / no acute changes   Assessment

## 2022-08-27 ENCOUNTER — Ambulatory Visit (HOSPITAL_COMMUNITY)
Admission: RE | Admit: 2022-08-27 | Discharge: 2022-08-27 | Disposition: A | Discharge status: Home / Self Care | Payer: Medicare Other | Source: Ambulatory Visit | Attending: Internal Medicine | Admitting: Internal Medicine

## 2022-08-27 ENCOUNTER — Ambulatory Visit: Payer: Medicare Other | Admitting: Internal Medicine

## 2022-08-27 ENCOUNTER — Encounter: Payer: Self-pay | Admitting: Internal Medicine

## 2022-08-27 VITALS — BP 132/84 | HR 74 | Ht 61.0 in | Wt 191.2 lb

## 2022-08-27 DIAGNOSIS — J449 Chronic obstructive pulmonary disease, unspecified: Secondary | ICD-10-CM | POA: Diagnosis not present

## 2022-08-27 DIAGNOSIS — R0602 Shortness of breath: Secondary | ICD-10-CM | POA: Diagnosis not present

## 2022-08-27 DIAGNOSIS — R059 Cough, unspecified: Secondary | ICD-10-CM | POA: Diagnosis not present

## 2022-08-27 MED ORDER — PANTOPRAZOLE SODIUM 40 MG PO TBEC: 40.0000 mg | DELAYED_RELEASE_TABLET | Freq: Every day | ORAL | 2 refills | Status: DC

## 2022-08-27 MED ORDER — FAMOTIDINE 20 MG PO TABS: ORAL_TABLET | ORAL | 11 refills | Status: DC

## 2022-08-27 MED ORDER — BREZTRI AEROSPHERE 160-9-4.8 MCG/ACT IN AERO: 2.0000 | INHALATION_SPRAY | Freq: Two times a day (BID) | RESPIRATORY_TRACT | 11 refills | Status: DC

## 2022-08-27 NOTE — Assessment & Plan Note (Addendum)
Quit smoking Feb 2024  - 01/26/2020  After extensive coaching inhaler device,  effectiveness =   50% from a baseline of 10 % > try breztri one bid due to concerns with uacs  - Chest LDSCT '@[facility]    01/29/22  Mild centrilobular emphysema with diffuse bronchial wall thickening  - 08/27/2022  After extensive coaching inhaler device,  effectiveness =   50% (late trigger short Ti ) > try breztri samples plus max gerd rx   DDX of  difficult airways management almost all start with A and  include Adherence, Ace Inhibitors, Acid Reflux, Active Sinus Disease, Alpha 1 Antitripsin deficiency, Anxiety masquerading as Airways dz,  ABPA,  Allergy(esp in young), Aspiration (esp in elderly), Adverse effects of meds,  Active smoking or vaping, A bunch of PE's (a small clot burden can't cause this syndrome unless there is already severe underlying pulm or vascular dz with poor reserve) plus two Bs  = Bronchiectasis and Beta blocker use..and one C= CHF  Adherence is always the initial "prime suspect" and is a multilayered concern that requires a "trust but verify" approach in every patient - starting with knowing how to use medications, especially inhalers, correctly, keeping up with refills and understanding the fundamental difference between maintenance and prns vs those medications only taken for a very short course and then stopped and not refilled.  See hfa teaching - return with all meds in hand using a trust but verify approach to confirm accurate Medication  Reconciliation The principal here is that until we are certain that the  patients are doing what we've asked, it makes no sense to ask them to do more.   ? Active smoking> says quit for good x 4 weeks  ? Acid (or non-acid) GERD > always difficult to exclude as up to 75% of pts in some series report no assoc GI/ Heartburn symptoms> rec max (24h)  acid suppression and diet restrictions/ reviewed and instructions given in writing.  Of the three most common causes of   Sub-acute / recurrent or chronic cough, only one (GERD)  can actually contribute to/ trigger  the other two (asthma and post nasal drip syndrome)  and perpetuate the cylce of cough.  While not intuitively obvious, many patients with chronic low grade reflux do not cough until there is a primary insult that disturbs the protective epithelial barrier and exposes sensitive nerve endings.   This is typically viral but can due to PNDS and  either may apply here.   The point is that once this occurs, it is difficult to eliminate the cycle  using anything but a maximally effective acid suppression regimen at least in the short run, accompanied by an appropriate diet to address non acid GERD and control / eliminate the cough itself if possible with the use of hard rock candies   ? Adverse effects of dpi > try off trelegy if favor of breztri ideally just one bid if can master hfa   ? Anxiety /depression/ deconditioning > usually at the bottom of this list of usual suspects but  note already on psychotropics and may interfere with adherence and also interpretation of response or lack thereof to symptom management which can be quite subjective > defer rx to PCP  ? Alpha on AT def > needs phenotype and set up for pfts next ov   ? Chf / cardiac asthma > check cxr > no chf          Each maintenance medication was  reviewed in detail including emphasizing most importantly the difference between maintenance and prns and under what circumstances the prns are to be triggered using an action plan format where appropriate.  Total time for H and P, chart review, counseling, reviewing hfa/dpi/neb device(s) and generating customized AVS unique to this office visit / same day charting  > 40 min with pt not seen in > 2.5 y

## 2022-08-31 ENCOUNTER — Telehealth: Payer: Self-pay | Admitting: *Deleted

## 2022-08-31 MED ORDER — FAMOTIDINE 20 MG PO TABS: ORAL_TABLET | ORAL | 11 refills | Status: DC

## 2022-08-31 MED ORDER — PANTOPRAZOLE SODIUM 40 MG PO TBEC: 40.0000 mg | DELAYED_RELEASE_TABLET | Freq: Every day | ORAL | 2 refills | Status: DC

## 2022-08-31 MED ORDER — BREZTRI AEROSPHERE 160-9-4.8 MCG/ACT IN AERO: 2.0000 | INHALATION_SPRAY | Freq: Two times a day (BID) | RESPIRATORY_TRACT | 11 refills | Status: AC

## 2022-08-31 NOTE — Telephone Encounter (Signed)
Chart shows that medications were sent to [facility] on 08/27/22. Resent medications. Nothing further needed

## 2022-09-01 ENCOUNTER — Encounter: Payer: Self-pay | Admitting: Internal Medicine

## 2022-10-07 NOTE — Progress Notes (Deleted)
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  Chief Complaint  Patient presents with   Pulmonary Consult    Referred by Dr. Sherryll Braun. Pt c/o DOE x 5 months- started after exposed to paint fumes. Sometimes gets winded just walking room to room. Also c/o cough- non prod.   Dyspnea:  Room to room s assoc cp  But  audible wheeze min better p saba hfa / neb  Cough: when get hot  Sleep: flat 2 pillows, wakes up after an hour or two smothering and sets up  Prednisone not much better, gained 30 lb  Rec Plan A = Automatic = Always=    Breztri one twice daily immediately when get up and 12 hours later  Work on inhaler technique:   Plan B = Backup (to supplement plan A, not to replace it) Only use your albuterol inhaler as a rescue medication  Plan C = Crisis (instead of Plan B but only if Plan B stops working) - only use your albuterol nebulizer if you first try Plan B and it fails to help  Try albuterol 15 min before an activity that you know would make you short of breath and see if it makes any difference  Please remember to go to the lab department  [facility]  for your tests - we will call you with the results when they are available.      08/27/2022  Re-establish ov/Jill [place]/Jill [place] re: AB maint on trelegy 100 one daily  and last prednisone 3 weeks prior to OV    Chief Complaint  Patient presents with   Consult    Pneumonia 4 weeks ago   Last seen 2021 for COPD cough and DOE  Stopped smoking 3 weeks ago   Dyspnea:  limited by L knee than breathing  Cough: slt yellow worst first thing in am Sleeping: 45 degree recliner  SABA use: not needing much/ neb once or twice a week  02: none  Covid status: x  4 vax  [facility]  Rec Stop trelegy for now Plan A = Automatic = Always=    Breztri 1-2 every 12 hours  Work on inhaler technique:    Plan B = Backup (to supplement plan A, not to replace it) Only use your albuterol inhaler as a rescue medication  Plan C = Crisis (instead of Plan B but only if Plan B stops working) - only use your albuterol nebulizer if you first try Plan B and it fails to help > Pantoprazole (protonix) 40 mg   Take  30-60 min before first meal of the day and Pepcid (famotidine)  20 mg after supper until return to [place]  GERD diet reviewed, bed blocks rec   Please remember to go to the  x-ray department >  cxr:   no acute changes  10/09/2022  f/u ov/[place]/Jill [place] re: *** maint on ***  No chief complaint on file.   Dyspnea:  *** Cough: *** Sleeping: *** SABA use: *** 02: *** Covid status: *** Lung cancer screening: ***   No obvious day to day or daytime variability or assoc excess/ purulent sputum or mucus plugs or hemoptysis or cp or chest tightness, subjective wheeze or overt sinus or hb symptoms.   *** without nocturnal  or early am exacerbation  of respiratory  c/o's or need for noct saba. Also denies any obvious fluctuation of symptoms with weather or environmental changes or other aggravating or alleviating factors except as outlined above   No unusual exposure hx or h/o childhood pna/ asthma or knowledge of premature birth.  Current Allergies, Complete Past Medical History, Past Surgical History, Family History, and Social History were reviewed in [employer] record.  ROS  The following are not active complaints unless  bolded Hoarseness, sore throat, dysphagia, dental problems, itching, sneezing,  nasal congestion or discharge of excess mucus or purulent secretions, ear ache,   fever, chills, sweats, unintended wt loss or wt gain, classically pleuritic or exertional cp,  orthopnea pnd or arm/hand swelling  or leg swelling, presyncope, palpitations, abdominal pain, anorexia, nausea, vomiting, diarrhea  or change in bowel habits or change in bladder habits, change in stools or change in urine, dysuria, hematuria,  rash, arthralgias, visual complaints, headache, numbness, weakness or ataxia or problems with walking or coordination,  change in mood or  memory.        No outpatient medications have been marked as taking for the 10/09/22 encounter (Appointment) with Nyoka Cowden, MD.                 Past Medical History:  Diagnosis Date   COPD (chronic obstructive pulmonary disease) ([facility]) 06/02/2016   Food poisoning 2019   AT [facility]   GERD (gastroesophageal reflux disease)    Headache    History of cardiac catheterization    Normal coronary arteries 2009   Hyperlipidemia    Middle cerebral artery aneurysm    Left - [facility] 2003   Primary localized osteoarthritis of left knee 03/08/2019       Objective:      10/09/2022       ***   08/27/22 191 lb 3.2 oz (86.7 kg)  01/06/22 194 lb 9.6 oz (88.3 kg)  05/15/21 186 lb 3.4 oz (84.5 kg)    Vital signs reviewed  10/09/2022  - Note at rest 02 sats  ***% on ***   General appearance:    ***     Mild barr **       Assessment

## 2022-10-09 ENCOUNTER — Ambulatory Visit: Payer: Medicare Other | Admitting: Internal Medicine

## 2022-10-14 DIAGNOSIS — M79672 Pain in left foot: Secondary | ICD-10-CM | POA: Diagnosis not present

## 2022-10-14 DIAGNOSIS — M79675 Pain in left toe(s): Secondary | ICD-10-CM | POA: Diagnosis not present

## 2022-10-14 DIAGNOSIS — L03032 Cellulitis of left toe: Secondary | ICD-10-CM | POA: Diagnosis not present

## 2022-10-14 DIAGNOSIS — L600 Ingrowing nail: Secondary | ICD-10-CM | POA: Diagnosis not present

## 2022-10-28 DIAGNOSIS — L03031 Cellulitis of right toe: Secondary | ICD-10-CM | POA: Diagnosis not present

## 2022-10-28 DIAGNOSIS — M79674 Pain in right toe(s): Secondary | ICD-10-CM | POA: Diagnosis not present

## 2022-10-28 DIAGNOSIS — M79671 Pain in right foot: Secondary | ICD-10-CM | POA: Diagnosis not present

## 2022-11-02 NOTE — Progress Notes (Unsigned)
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  Chief Complaint  Patient presents with   Pulmonary Consult    Referred by Dr. Sherryll BurgerShah. Pt c/o DOE x 5 months- started after exposed to paint fumes. Sometimes gets winded just walking room to room. Also c/o cough- non prod.   Dyspnea:  Room to room s assoc cp  But  audible wheeze min better p saba hfa / neb  Cough: when get hot  Sleep: flat 2 pillows, wakes up after an hour or two smothering and sets up  Prednisone not much better, gained 30 lb  Rec Plan A = Automatic = Always=    Breztri one twice daily immediately when get up and 12 hours later  Work on inhaler technique:   Plan B = Backup (to supplement plan A, not to replace it) Only use your albuterol inhaler as a rescue medication  Plan C = Crisis (instead of Plan B but only if Plan B stops working) - only use your albuterol nebulizer if you first try Plan B and it fails to help  Try albuterol 15 min before an activity that you know would make you short of breath and see if it makes any difference  Please remember to go to the lab department  [facility]  for your tests - we will call you with the results when they are available.      08/27/2022  Re-establish ov/Jill [place]/Inari Shin re: AB maint on trelegy 100 one daily  and last prednisone 3 weeks prior to OV    Chief Complaint  Patient presents with   Consult    Pneumonia 4 weeks ago   Last seen 2021 for COPD cough and DOE  Stopped smoking 3 weeks ago   Dyspnea:  limited by L knee than breathing  Cough: slt yellow worst first thing in am Sleeping: 45 degree recliner  SABA use: not needing much/ neb once or twice a week  02: none  Covid status: x  4 vax  [facility]  Rec Stop trelegy for now Plan A = Automatic = Always=    Breztri 1-2 every 12 hours  Work on inhaler technique:    Plan B = Backup (to supplement plan A, not to replace it) Only use your albuterol inhaler as a rescue medication  Plan C = Crisis (instead of Plan B but only if Plan B stops working) - only use your albuterol nebulizer if you first try Plan B and it fails to help > Pantoprazole (protonix) 40 mg   Take  30-60 min before first meal of the day and Pepcid (famotidine)  20 mg after supper until return to [place]  GERD diet reviewed, bed blocks rec   Please remember to go to the  x-ray department >  cxr:   no acute changes  11/03/2022  f/u ov/[place]/Eamon Tantillo re: AB/ no pfts in system maint on Breztri but not full dose/ some smoking  Chief Complaint  Patient presents with   Follow-up    Pt f/u states that she is having chest congestion, wheezing and increased SOB. She believes that the pollen is causing her these problems. Denies fevers. Does not use O2  Dyspnea:  worse for a month, did not change breztri 2 bid  Cough: pale yellow  Sleeping: 45 degree recliner  SABA use: 2-3 x per day/ nebulizer once at bedtime  02: none     No obvious day to day or daytime variability or assoc excess/ purulent sputum or mucus plugs or hemoptysis or cp or chest tightness, subjective wheeze or overt sinus or hb symptoms.   Sleeping  without nocturnal  or early am exacerbation  of respiratory  c/o's or need for noct saba. Also denies any obvious fluctuation of symptoms with weather or environmental changes or other aggravating or alleviating factors except as outlined above   No  unusual exposure hx or h/o childhood pna/ asthma or knowledge of premature birth.  Current Allergies, Complete Past Medical History, Past Surgical History, Family History, and Social History were reviewed in [employer] record.  ROS  The following are not active complaints unless bolded Hoarseness, sore throat, dysphagia, dental problems, itching, sneezing,  nasal congestion or discharge of excess mucus or purulent secretions, ear ache,   fever, chills, sweats, unintended wt loss or wt gain, classically pleuritic or exertional cp,  orthopnea pnd or arm/hand swelling  or leg swelling, presyncope, palpitations, abdominal pain, anorexia, nausea, vomiting, diarrhea  or change in bowel habits or change in bladder habits, change in stools or change in urine, dysuria, hematuria,  rash, arthralgias, visual complaints, headache, numbness, weakness or ataxia or problems with walking or coordination,  change in mood or  memory.        Current Meds  Medication Sig   acetaminophen (TYLENOL) 500 MG tablet Take 500 mg by mouth every 6 (six) hours as needed for moderate pain or headache.   albuterol (ACCUNEB) 1.25 MG/3ML nebulizer solution Take 1 ampule by nebulization every 6 (six) hours as needed for wheezing.   Albuterol Sulfate 108 (90 Base) MCG/ACT AEPB Inhale 1 puff into the lungs every 6 (six) hours as needed (for shortness of breath/wheezing).    Budeson-Glycopyrrol-Formoterol (BREZTRI AEROSPHERE) 160-9-4.8 MCG/ACT AERO Inhale 2 puffs into the lungs in the morning and at bedtime.   Cholecalciferol (VITAMIN D) 125 MCG (5000 UT) CAPS Take by mouth. One daily   citalopram (CELEXA) 20 MG tablet Take 20 mg by mouth daily.   dicyclomine (BENTYL) 10 MG capsule Take 1 capsule (10 mg total) by mouth 2 (two) times daily as needed for spasms.   Multiple Vitamin (MULTIVITAMIN) tablet Take 1 tablet by mouth daily.   pantoprazole (PROTONIX) 40 MG tablet Take 1 tablet (40 mg total) by mouth daily.  Take 30-60 min before first meal of the day   [DISCONTINUED] pantoprazole (PROTONIX) 40 MG tablet Take 40 mg by mouth daily. qam                 Past Medical History:  Diagnosis Date   COPD (chronic obstructive pulmonary disease) ([facility]) 06/02/2016   Food poisoning 2019   AT [facility]   GERD (gastroesophageal reflux disease)    Headache    History of cardiac catheterization    Normal coronary arteries 2009   Hyperlipidemia  Middle cerebral artery aneurysm    Left - [facility] 2003   Primary localized osteoarthritis of left knee 03/08/2019       Objective:      11/03/2022       195   08/27/22 191 lb 3.2 oz (86.7 kg)  01/06/22 194 lb 9.6 oz (88.3 kg)  05/15/21 186 lb 3.4 oz (84.5 kg)    Vital signs reviewed  11/03/2022  - Note at rest 02 sats  90% on RA   General appearance:    ambulatory obese wf  somewhat awkward gait  due to L knee/leg swelling    HEENT : Oropharynx  clear  Nasal turbinates mild non-specific edema   NECK :  without  apparent JVD/ palpable Nodes/TM    LUNGS: no acc muscle use,  Mild barrel /mod kyphotic  contour chest wall with bilateral  Distant bs s audible wheeze and  without cough on insp or exp maneuvers  and mild  Hyperresonant  to  percussion bilaterally     CV:  RRR  no s3 or murmur or increase in P2, and no edema   ABD:  obese/soft and nontender with pos end  insp Hoover's  in the supine position.  No bruits or organomegaly appreciated   MS:  Nl gait/ ext warm without deformities Or obvious joint restrictions  calf tenderness, cyanosis or clubbing     SKIN: warm and dry without lesions    NEURO:  alert, approp, nl sensorium with  no motor or cerebellar deficits apparent.       Assessment

## 2022-11-03 ENCOUNTER — Ambulatory Visit: Payer: Medicare Other | Admitting: Internal Medicine

## 2022-11-03 ENCOUNTER — Encounter: Payer: Self-pay | Admitting: Internal Medicine

## 2022-11-03 VITALS — BP 126/67 | HR 101 | Ht 61.0 in | Wt 195.2 lb

## 2022-11-03 DIAGNOSIS — R062 Wheezing: Secondary | ICD-10-CM

## 2022-11-03 DIAGNOSIS — J449 Chronic obstructive pulmonary disease, unspecified: Secondary | ICD-10-CM

## 2022-11-03 DIAGNOSIS — F17210 Nicotine dependence, cigarettes, uncomplicated: Secondary | ICD-10-CM | POA: Diagnosis not present

## 2022-11-03 DIAGNOSIS — M7122 Synovial cyst of popliteal space [Baker], left knee: Secondary | ICD-10-CM | POA: Diagnosis not present

## 2022-11-03 DIAGNOSIS — M7121 Synovial cyst of popliteal space [Baker], right knee: Secondary | ICD-10-CM | POA: Diagnosis not present

## 2022-11-03 MED ORDER — METHYLPREDNISOLONE ACETATE 80 MG/ML IJ SUSP
120.0000 mg | Freq: Once | INTRAMUSCULAR | Status: AC
Start: 2022-11-03 — End: 2022-11-03
  Administered 2022-11-03: 120 mg via INTRAMUSCULAR

## 2022-11-03 NOTE — Assessment & Plan Note (Addendum)
Quit smoking Feb 2024  - 01/26/2020  After extensive coaching inhaler device,  effectiveness =   50% from a baseline of 10 % > try breztri one bid due to concerns with uacs  - Chest LDSCT @[facility]    01/29/22  Mild centrilobular emphysema with diffuse bronchial wall thickening  - 08/27/2022  After extensive coaching inhaler device,  effectiveness =   50% (late trigger short Ti ) > try breztri samples plus max gerd rx   - In addition to office visit today,  extra time was spent during the ov showing the patient the proper method to effectively use an HFA  inhaler were discussed and demonstrated to the patient using teach back method > improved to 50% from baseline < 25% so keep trying to master and set up for pfts next available - other options smi stiolto or neb formoterol/yupelri  Plus depomedrol 120 mg IM today for mild flare of copd assoc with rhintis ? Allergic component      Each maintenance medication was reviewed in detail including emphasizing most importantly the difference between maintenance and prns and under what circumstances the prns are to be triggered using an action plan format where appropriate.  Total time for H and P, chart review, counseling, reviewing when to use which  device(s) per AVS ABC action plan and generating customized AVS unique to this office visit / same day charting = 25  min

## 2022-11-03 NOTE — Patient Instructions (Addendum)
Plan A = Automatic = Always=    Breztri Take 2 puffs first thing in am and then another 2 puffs about 12 hours later.     Work on inhaler technique:  relax and gently blow all the way out then take a nice smooth full deep breath back in, triggering the inhaler at same time you start breathing in.  Hold breath in for at least  5 seconds if you can. Blow out breztri  thru nose. Rinse and gargle with water when done.  If mouth or throat bother you at all,  try brushing teeth/gums/tongue with arm and hammer toothpaste/ make a slurry and gargle and spit out.  - remember how golfers take practice swings to use your empty device before the real one      Plan B = Backup (to supplement plan A, not to replace it) Only use your albuterol inhaler as a rescue medication to be used if you can't catch your breath by resting or doing a relaxed purse lip breathing pattern.  - The less you use it, the better it will work when you need it. - Ok to use the inhaler up to 2 puffs  every 4 hours if you must but call for appointment if use goes up over your usual need - Don't leave home without it !!  (think of it like the spare tire for your car)   Plan C = Crisis (instead of Plan B but only if Plan B stops working) - only use your albuterol nebulizer if you first try Plan B and it fails to help > ok to use the nebulizer up to every 4 hours but if start needing it regularly call for immediate appointment  The key is to stop smoking completely before smoking completely stops you!    Depomedrol 120 mg IM   My office will be contacting you by phone for referral for PFTs @ [facility]=before next visit  - if you don't hear back from my office within one week please call us back or notify us thru MyChart and we'll address it right away.   Please schedule a follow up office visit in 6 weeks, call sooner if needed - bring your inhalers

## 2022-11-03 NOTE — Assessment & Plan Note (Signed)

## 2022-11-04 DIAGNOSIS — M79662 Pain in left lower leg: Secondary | ICD-10-CM | POA: Diagnosis not present

## 2022-11-04 DIAGNOSIS — M7989 Other specified soft tissue disorders: Secondary | ICD-10-CM | POA: Diagnosis not present

## 2022-11-04 DIAGNOSIS — R600 Localized edema: Secondary | ICD-10-CM | POA: Diagnosis not present

## 2022-11-11 DIAGNOSIS — L600 Ingrowing nail: Secondary | ICD-10-CM | POA: Diagnosis not present

## 2022-11-11 DIAGNOSIS — M79671 Pain in right foot: Secondary | ICD-10-CM | POA: Diagnosis not present

## 2022-11-11 DIAGNOSIS — M79674 Pain in right toe(s): Secondary | ICD-10-CM | POA: Diagnosis not present

## 2022-12-04 DIAGNOSIS — G4710 Hypersomnia, unspecified: Secondary | ICD-10-CM | POA: Diagnosis not present

## 2022-12-04 DIAGNOSIS — G4733 Obstructive sleep apnea (adult) (pediatric): Secondary | ICD-10-CM | POA: Diagnosis not present

## 2022-12-04 DIAGNOSIS — F17200 Nicotine dependence, unspecified, uncomplicated: Secondary | ICD-10-CM | POA: Diagnosis not present

## 2022-12-04 DIAGNOSIS — J441 Chronic obstructive pulmonary disease with (acute) exacerbation: Secondary | ICD-10-CM | POA: Diagnosis not present

## 2022-12-04 DIAGNOSIS — R030 Elevated blood-pressure reading, without diagnosis of hypertension: Secondary | ICD-10-CM | POA: Diagnosis not present

## 2022-12-04 DIAGNOSIS — R5383 Other fatigue: Secondary | ICD-10-CM | POA: Diagnosis not present

## 2022-12-07 DIAGNOSIS — D649 Anemia, unspecified: Secondary | ICD-10-CM | POA: Diagnosis not present

## 2022-12-07 DIAGNOSIS — D529 Folate deficiency anemia, unspecified: Secondary | ICD-10-CM | POA: Diagnosis not present

## 2022-12-07 DIAGNOSIS — D519 Vitamin B12 deficiency anemia, unspecified: Secondary | ICD-10-CM | POA: Diagnosis not present

## 2022-12-09 ENCOUNTER — Encounter (HOSPITAL_BASED_OUTPATIENT_CLINIC_OR_DEPARTMENT_OTHER): Payer: Medicare Other

## 2022-12-11 DIAGNOSIS — F17200 Nicotine dependence, unspecified, uncomplicated: Secondary | ICD-10-CM | POA: Diagnosis not present

## 2022-12-11 DIAGNOSIS — R030 Elevated blood-pressure reading, without diagnosis of hypertension: Secondary | ICD-10-CM | POA: Diagnosis not present

## 2022-12-11 DIAGNOSIS — G4733 Obstructive sleep apnea (adult) (pediatric): Secondary | ICD-10-CM | POA: Diagnosis not present

## 2022-12-11 DIAGNOSIS — J449 Chronic obstructive pulmonary disease, unspecified: Secondary | ICD-10-CM | POA: Diagnosis not present

## 2022-12-11 DIAGNOSIS — R5383 Other fatigue: Secondary | ICD-10-CM | POA: Diagnosis not present

## 2022-12-17 ENCOUNTER — Ambulatory Visit: Payer: Medicare Other | Admitting: Internal Medicine

## 2022-12-30 DIAGNOSIS — D649 Anemia, unspecified: Secondary | ICD-10-CM | POA: Diagnosis not present

## 2023-01-07 DIAGNOSIS — R5383 Other fatigue: Secondary | ICD-10-CM | POA: Diagnosis not present

## 2023-01-07 DIAGNOSIS — D529 Folate deficiency anemia, unspecified: Secondary | ICD-10-CM | POA: Diagnosis not present

## 2023-01-07 DIAGNOSIS — K219 Gastro-esophageal reflux disease without esophagitis: Secondary | ICD-10-CM | POA: Diagnosis not present

## 2023-01-11 DIAGNOSIS — K625 Hemorrhage of anus and rectum: Secondary | ICD-10-CM | POA: Diagnosis not present

## 2023-01-11 DIAGNOSIS — Z7985 Long-term (current) use of injectable non-insulin antidiabetic drugs: Secondary | ICD-10-CM | POA: Diagnosis not present

## 2023-01-11 DIAGNOSIS — F17210 Nicotine dependence, cigarettes, uncomplicated: Secondary | ICD-10-CM | POA: Diagnosis not present

## 2023-01-11 DIAGNOSIS — K5730 Diverticulosis of large intestine without perforation or abscess without bleeding: Secondary | ICD-10-CM | POA: Diagnosis not present

## 2023-01-11 DIAGNOSIS — D125 Benign neoplasm of sigmoid colon: Secondary | ICD-10-CM | POA: Diagnosis not present

## 2023-01-11 DIAGNOSIS — D128 Benign neoplasm of rectum: Secondary | ICD-10-CM | POA: Diagnosis not present

## 2023-01-11 DIAGNOSIS — K621 Rectal polyp: Secondary | ICD-10-CM | POA: Diagnosis not present

## 2023-01-11 DIAGNOSIS — J449 Chronic obstructive pulmonary disease, unspecified: Secondary | ICD-10-CM | POA: Diagnosis not present

## 2023-01-11 DIAGNOSIS — I2510 Atherosclerotic heart disease of native coronary artery without angina pectoris: Secondary | ICD-10-CM | POA: Diagnosis not present

## 2023-01-11 DIAGNOSIS — D122 Benign neoplasm of ascending colon: Secondary | ICD-10-CM | POA: Diagnosis not present

## 2023-01-11 DIAGNOSIS — K219 Gastro-esophageal reflux disease without esophagitis: Secondary | ICD-10-CM | POA: Diagnosis not present

## 2023-01-11 DIAGNOSIS — K635 Polyp of colon: Secondary | ICD-10-CM | POA: Diagnosis not present

## 2023-01-11 DIAGNOSIS — K2950 Unspecified chronic gastritis without bleeding: Secondary | ICD-10-CM | POA: Diagnosis not present

## 2023-01-11 DIAGNOSIS — K6289 Other specified diseases of anus and rectum: Secondary | ICD-10-CM | POA: Diagnosis not present

## 2023-01-12 ENCOUNTER — Encounter (HOSPITAL_BASED_OUTPATIENT_CLINIC_OR_DEPARTMENT_OTHER): Payer: Medicare Other

## 2023-01-14 DIAGNOSIS — R030 Elevated blood-pressure reading, without diagnosis of hypertension: Secondary | ICD-10-CM | POA: Diagnosis not present

## 2023-01-14 DIAGNOSIS — R5383 Other fatigue: Secondary | ICD-10-CM | POA: Diagnosis not present

## 2023-01-14 DIAGNOSIS — J449 Chronic obstructive pulmonary disease, unspecified: Secondary | ICD-10-CM | POA: Diagnosis not present

## 2023-01-14 DIAGNOSIS — G4733 Obstructive sleep apnea (adult) (pediatric): Secondary | ICD-10-CM | POA: Diagnosis not present

## 2023-01-14 DIAGNOSIS — F17200 Nicotine dependence, unspecified, uncomplicated: Secondary | ICD-10-CM | POA: Diagnosis not present

## 2023-01-21 ENCOUNTER — Ambulatory Visit: Payer: Medicare Other | Admitting: Internal Medicine

## 2023-01-26 DIAGNOSIS — M79675 Pain in left toe(s): Secondary | ICD-10-CM | POA: Diagnosis not present

## 2023-01-26 DIAGNOSIS — M79672 Pain in left foot: Secondary | ICD-10-CM | POA: Diagnosis not present

## 2023-01-26 DIAGNOSIS — L600 Ingrowing nail: Secondary | ICD-10-CM | POA: Diagnosis not present

## 2023-02-03 DIAGNOSIS — D122 Benign neoplasm of ascending colon: Secondary | ICD-10-CM | POA: Diagnosis not present

## 2023-02-03 DIAGNOSIS — K5790 Diverticulosis of intestine, part unspecified, without perforation or abscess without bleeding: Secondary | ICD-10-CM | POA: Diagnosis not present

## 2023-02-10 DIAGNOSIS — Z131 Encounter for screening for diabetes mellitus: Secondary | ICD-10-CM | POA: Diagnosis not present

## 2023-02-10 DIAGNOSIS — E039 Hypothyroidism, unspecified: Secondary | ICD-10-CM | POA: Diagnosis not present

## 2023-02-10 DIAGNOSIS — J441 Chronic obstructive pulmonary disease with (acute) exacerbation: Secondary | ICD-10-CM | POA: Diagnosis not present

## 2023-02-10 DIAGNOSIS — G4733 Obstructive sleep apnea (adult) (pediatric): Secondary | ICD-10-CM | POA: Diagnosis not present

## 2023-02-10 DIAGNOSIS — E559 Vitamin D deficiency, unspecified: Secondary | ICD-10-CM | POA: Diagnosis not present

## 2023-02-10 DIAGNOSIS — F17200 Nicotine dependence, unspecified, uncomplicated: Secondary | ICD-10-CM | POA: Diagnosis not present

## 2023-02-10 DIAGNOSIS — E041 Nontoxic single thyroid nodule: Secondary | ICD-10-CM | POA: Diagnosis not present

## 2023-02-10 DIAGNOSIS — D649 Anemia, unspecified: Secondary | ICD-10-CM | POA: Diagnosis not present

## 2023-02-10 DIAGNOSIS — J449 Chronic obstructive pulmonary disease, unspecified: Secondary | ICD-10-CM | POA: Diagnosis not present

## 2023-02-17 ENCOUNTER — Encounter (HOSPITAL_BASED_OUTPATIENT_CLINIC_OR_DEPARTMENT_OTHER): Payer: Self-pay

## 2023-02-17 DIAGNOSIS — G2581 Restless legs syndrome: Secondary | ICD-10-CM | POA: Diagnosis not present

## 2023-02-17 DIAGNOSIS — R599 Enlarged lymph nodes, unspecified: Secondary | ICD-10-CM | POA: Diagnosis not present

## 2023-02-17 DIAGNOSIS — Z23 Encounter for immunization: Secondary | ICD-10-CM | POA: Diagnosis not present

## 2023-02-17 DIAGNOSIS — G4710 Hypersomnia, unspecified: Secondary | ICD-10-CM

## 2023-02-17 DIAGNOSIS — R030 Elevated blood-pressure reading, without diagnosis of hypertension: Secondary | ICD-10-CM | POA: Diagnosis not present

## 2023-02-17 DIAGNOSIS — K219 Gastro-esophageal reflux disease without esophagitis: Secondary | ICD-10-CM | POA: Diagnosis not present

## 2023-02-17 DIAGNOSIS — Z0001 Encounter for general adult medical examination with abnormal findings: Secondary | ICD-10-CM | POA: Diagnosis not present

## 2023-02-17 DIAGNOSIS — F17200 Nicotine dependence, unspecified, uncomplicated: Secondary | ICD-10-CM | POA: Diagnosis not present

## 2023-02-17 DIAGNOSIS — R5383 Other fatigue: Secondary | ICD-10-CM | POA: Diagnosis not present

## 2023-02-17 DIAGNOSIS — M810 Age-related osteoporosis without current pathological fracture: Secondary | ICD-10-CM | POA: Diagnosis not present

## 2023-02-17 DIAGNOSIS — E041 Nontoxic single thyroid nodule: Secondary | ICD-10-CM | POA: Diagnosis not present

## 2023-02-17 DIAGNOSIS — J449 Chronic obstructive pulmonary disease, unspecified: Secondary | ICD-10-CM | POA: Diagnosis not present

## 2023-02-17 DIAGNOSIS — J441 Chronic obstructive pulmonary disease with (acute) exacerbation: Secondary | ICD-10-CM | POA: Diagnosis not present

## 2023-02-17 DIAGNOSIS — R0683 Snoring: Secondary | ICD-10-CM

## 2023-02-26 DIAGNOSIS — M810 Age-related osteoporosis without current pathological fracture: Secondary | ICD-10-CM | POA: Diagnosis not present

## 2023-03-03 DIAGNOSIS — E041 Nontoxic single thyroid nodule: Secondary | ICD-10-CM | POA: Diagnosis not present

## 2023-03-03 DIAGNOSIS — E039 Hypothyroidism, unspecified: Secondary | ICD-10-CM | POA: Diagnosis not present

## 2023-03-11 DIAGNOSIS — Z1231 Encounter for screening mammogram for malignant neoplasm of breast: Secondary | ICD-10-CM | POA: Diagnosis not present

## 2023-03-17 ENCOUNTER — Encounter: Payer: Medicare Other | Admitting: Pulmonary Disease

## 2023-03-18 DIAGNOSIS — J329 Chronic sinusitis, unspecified: Secondary | ICD-10-CM | POA: Diagnosis not present

## 2023-03-18 DIAGNOSIS — J441 Chronic obstructive pulmonary disease with (acute) exacerbation: Secondary | ICD-10-CM | POA: Diagnosis not present

## 2023-03-22 DIAGNOSIS — J441 Chronic obstructive pulmonary disease with (acute) exacerbation: Secondary | ICD-10-CM | POA: Diagnosis not present

## 2023-03-22 DIAGNOSIS — J329 Chronic sinusitis, unspecified: Secondary | ICD-10-CM | POA: Diagnosis not present

## 2023-03-29 DIAGNOSIS — J441 Chronic obstructive pulmonary disease with (acute) exacerbation: Secondary | ICD-10-CM | POA: Diagnosis not present

## 2023-03-29 DIAGNOSIS — J329 Chronic sinusitis, unspecified: Secondary | ICD-10-CM | POA: Diagnosis not present

## 2023-03-30 ENCOUNTER — Ambulatory Visit (HOSPITAL_COMMUNITY): Payer: Medicare Other | Attending: Internal Medicine

## 2023-04-01 ENCOUNTER — Ambulatory Visit (INDEPENDENT_AMBULATORY_CARE_PROVIDER_SITE_OTHER): Payer: Medicare Other | Admitting: Otolaryngology

## 2023-04-01 ENCOUNTER — Encounter (INDEPENDENT_AMBULATORY_CARE_PROVIDER_SITE_OTHER): Payer: Self-pay | Admitting: Otolaryngology

## 2023-04-01 VITALS — Ht 61.0 in | Wt 184.0 lb

## 2023-04-01 DIAGNOSIS — E042 Nontoxic multinodular goiter: Secondary | ICD-10-CM | POA: Diagnosis not present

## 2023-04-01 DIAGNOSIS — D440 Neoplasm of uncertain behavior of thyroid gland: Secondary | ICD-10-CM | POA: Insufficient documentation

## 2023-04-01 NOTE — Progress Notes (Signed)
Patient ID: Jill Braun, female   DOB: 28-Feb-1946, 77 y.o.   MRN: [id number]  Follow-up: Multinodular thyroid goiter  HPI: The patient is a 77 year old female who returns today for follow-up evaluation of her thyroid nodules.  The patient was last seen in 2022.  At that time, her screening chest CT scan showed a 1.7 cm left thyroid nodule.  Fine-needle aspiration biopsy of the nodule was negative for malignancy.  Her subsequent neck ultrasound in 2022 shows multiple thyroid nodules, including a 1.6 cm left thyroid nodule and a 2.1 cm right thyroid nodule.  According to the patient, she has been asymptomatic.  She denies any dysphagia, odynophagia, dyspnea, or dysphonia.  She underwent a repeat neck ultrasound in 2024.  The ultrasound showed a 1.3 cm left thyroid nodule and a 2.1 cm right thyroid nodule.  The patient is currently not symptomatic.  She is tolerating oral intake well.  Exam: General: Communicates without difficulty, well nourished, no acute distress. Head: Normocephalic, no evidence injury, no tenderness, facial buttresses intact without stepoff. Face/sinus: No tenderness to palpation and percussion. Facial movement is normal and symmetric. Eyes: PERRL, EOMI. No scleral icterus, conjunctivae clear. Neuro: CN II exam reveals vision grossly intact.  No nystagmus at any point of gaze. Ears: Auricles well formed without lesions.  Ear canals are intact without mass or lesion.  No erythema or edema is appreciated.  The TMs are intact without fluid. Nose: External evaluation reveals normal support and skin without lesions.  Dorsum is intact.  Anterior rhinoscopy reveals pink mucosa over anterior aspect of inferior turbinates and intact septum.  No purulence noted. Oral:  Oral cavity and oropharynx are intact, symmetric, without erythema or edema.  Mucosa is moist without lesions. Neck: Full range of motion without pain.  There is no significant lymphadenopathy.  No masses palpable.  Thyroid bed within  normal limits to palpation.  Parotid glands and submandibular glands equal bilaterally without mass.  Trachea is midline. Neuro:  CN 2-12 grossly intact.   Assessment 1.  Stable multinodular thyroid goiter.  Her recent ultrasound did not show any significant enlargement of her nodules. 2.  Her previous fine-needle aspiration biopsy of her left thyroid nodule was negative for malignancy. 3.  The patient is currently asymptomatic.  Plan  1.  The physical exam findings and the ultrasound results are reviewed with the patient. 2.  Based on the above findings, we will proceed with conservative observation. 3.  Repeat thyroid ultrasound in 1 year. 4.  The patient will return for reevaluation in 1 year.

## 2023-04-08 DIAGNOSIS — J441 Chronic obstructive pulmonary disease with (acute) exacerbation: Secondary | ICD-10-CM | POA: Diagnosis not present

## 2023-04-08 DIAGNOSIS — J329 Chronic sinusitis, unspecified: Secondary | ICD-10-CM | POA: Diagnosis not present

## 2023-04-10 ENCOUNTER — Other Ambulatory Visit: Payer: Self-pay | Admitting: Internal Medicine

## 2023-05-20 DIAGNOSIS — G2581 Restless legs syndrome: Secondary | ICD-10-CM | POA: Diagnosis not present

## 2023-05-20 DIAGNOSIS — E041 Nontoxic single thyroid nodule: Secondary | ICD-10-CM | POA: Diagnosis not present

## 2023-05-20 DIAGNOSIS — J441 Chronic obstructive pulmonary disease with (acute) exacerbation: Secondary | ICD-10-CM | POA: Diagnosis not present

## 2023-05-20 DIAGNOSIS — R599 Enlarged lymph nodes, unspecified: Secondary | ICD-10-CM | POA: Diagnosis not present

## 2023-05-20 DIAGNOSIS — J449 Chronic obstructive pulmonary disease, unspecified: Secondary | ICD-10-CM | POA: Diagnosis not present

## 2023-05-20 DIAGNOSIS — G4733 Obstructive sleep apnea (adult) (pediatric): Secondary | ICD-10-CM | POA: Diagnosis not present

## 2023-05-20 DIAGNOSIS — Z23 Encounter for immunization: Secondary | ICD-10-CM | POA: Diagnosis not present

## 2023-05-20 DIAGNOSIS — F17200 Nicotine dependence, unspecified, uncomplicated: Secondary | ICD-10-CM | POA: Diagnosis not present

## 2023-05-20 DIAGNOSIS — R5383 Other fatigue: Secondary | ICD-10-CM | POA: Diagnosis not present

## 2023-05-20 DIAGNOSIS — M810 Age-related osteoporosis without current pathological fracture: Secondary | ICD-10-CM | POA: Diagnosis not present

## 2023-05-20 DIAGNOSIS — K219 Gastro-esophageal reflux disease without esophagitis: Secondary | ICD-10-CM | POA: Diagnosis not present

## 2023-06-02 DIAGNOSIS — M79675 Pain in left toe(s): Secondary | ICD-10-CM | POA: Diagnosis not present

## 2023-06-02 DIAGNOSIS — L03032 Cellulitis of left toe: Secondary | ICD-10-CM | POA: Diagnosis not present

## 2023-06-02 DIAGNOSIS — M79672 Pain in left foot: Secondary | ICD-10-CM | POA: Diagnosis not present

## 2023-06-02 DIAGNOSIS — L600 Ingrowing nail: Secondary | ICD-10-CM | POA: Diagnosis not present

## 2023-06-16 DIAGNOSIS — L600 Ingrowing nail: Secondary | ICD-10-CM | POA: Diagnosis not present

## 2023-06-16 DIAGNOSIS — M79675 Pain in left toe(s): Secondary | ICD-10-CM | POA: Diagnosis not present

## 2023-06-16 DIAGNOSIS — M79672 Pain in left foot: Secondary | ICD-10-CM | POA: Diagnosis not present

## 2023-06-16 DIAGNOSIS — L03032 Cellulitis of left toe: Secondary | ICD-10-CM | POA: Diagnosis not present

## 2023-08-09 DIAGNOSIS — L820 Inflamed seborrheic keratosis: Secondary | ICD-10-CM | POA: Diagnosis not present

## 2023-08-09 DIAGNOSIS — B078 Other viral warts: Secondary | ICD-10-CM | POA: Diagnosis not present

## 2023-08-13 ENCOUNTER — Encounter (INDEPENDENT_AMBULATORY_CARE_PROVIDER_SITE_OTHER): Payer: Self-pay | Admitting: *Deleted

## 2023-08-20 DIAGNOSIS — R5383 Other fatigue: Secondary | ICD-10-CM | POA: Diagnosis not present

## 2023-08-20 DIAGNOSIS — D649 Anemia, unspecified: Secondary | ICD-10-CM | POA: Diagnosis not present

## 2023-08-20 DIAGNOSIS — K219 Gastro-esophageal reflux disease without esophagitis: Secondary | ICD-10-CM | POA: Diagnosis not present

## 2023-08-20 DIAGNOSIS — Z131 Encounter for screening for diabetes mellitus: Secondary | ICD-10-CM | POA: Diagnosis not present

## 2023-08-26 DIAGNOSIS — K219 Gastro-esophageal reflux disease without esophagitis: Secondary | ICD-10-CM | POA: Diagnosis not present

## 2023-08-26 DIAGNOSIS — G4733 Obstructive sleep apnea (adult) (pediatric): Secondary | ICD-10-CM | POA: Diagnosis not present

## 2023-08-26 DIAGNOSIS — F17200 Nicotine dependence, unspecified, uncomplicated: Secondary | ICD-10-CM | POA: Diagnosis not present

## 2023-08-26 DIAGNOSIS — E041 Nontoxic single thyroid nodule: Secondary | ICD-10-CM | POA: Diagnosis not present

## 2023-08-27 DIAGNOSIS — M810 Age-related osteoporosis without current pathological fracture: Secondary | ICD-10-CM | POA: Diagnosis not present

## 2023-08-27 DIAGNOSIS — Z79620 Long term (current) use of immunosuppressive biologic: Secondary | ICD-10-CM | POA: Diagnosis not present

## 2023-09-27 DIAGNOSIS — R300 Dysuria: Secondary | ICD-10-CM | POA: Diagnosis not present

## 2023-10-08 DIAGNOSIS — R071 Chest pain on breathing: Secondary | ICD-10-CM | POA: Diagnosis not present

## 2023-10-08 DIAGNOSIS — R9431 Abnormal electrocardiogram [ECG] [EKG]: Secondary | ICD-10-CM | POA: Diagnosis not present

## 2023-10-08 DIAGNOSIS — R0789 Other chest pain: Secondary | ICD-10-CM | POA: Diagnosis not present

## 2023-10-28 DIAGNOSIS — J441 Chronic obstructive pulmonary disease with (acute) exacerbation: Secondary | ICD-10-CM | POA: Diagnosis not present

## 2023-10-29 DIAGNOSIS — J441 Chronic obstructive pulmonary disease with (acute) exacerbation: Secondary | ICD-10-CM | POA: Diagnosis not present

## 2023-12-20 DIAGNOSIS — L820 Inflamed seborrheic keratosis: Secondary | ICD-10-CM | POA: Diagnosis not present

## 2023-12-20 DIAGNOSIS — L570 Actinic keratosis: Secondary | ICD-10-CM | POA: Diagnosis not present

## 2023-12-20 DIAGNOSIS — X32XXXD Exposure to sunlight, subsequent encounter: Secondary | ICD-10-CM | POA: Diagnosis not present

## 2024-01-15 ENCOUNTER — Other Ambulatory Visit: Payer: Self-pay | Admitting: Internal Medicine

## 2024-01-26 DIAGNOSIS — M2021 Hallux rigidus, right foot: Secondary | ICD-10-CM | POA: Diagnosis not present

## 2024-01-26 DIAGNOSIS — M79671 Pain in right foot: Secondary | ICD-10-CM | POA: Diagnosis not present

## 2024-02-02 ENCOUNTER — Ambulatory Visit: Admitting: Podiatry

## 2024-02-02 ENCOUNTER — Encounter: Payer: Self-pay | Admitting: Podiatry

## 2024-02-02 ENCOUNTER — Ambulatory Visit (INDEPENDENT_AMBULATORY_CARE_PROVIDER_SITE_OTHER)

## 2024-02-02 VITALS — Ht 61.0 in | Wt 184.0 lb

## 2024-02-02 DIAGNOSIS — M7752 Other enthesopathy of left foot: Secondary | ICD-10-CM

## 2024-02-02 DIAGNOSIS — M2022 Hallux rigidus, left foot: Secondary | ICD-10-CM

## 2024-02-04 NOTE — Progress Notes (Signed)
 Chief Complaint  Patient presents with   Toe Pain    Pt is here due to left great toe pain, states the pain has been there for over a year, states it hurts when she walks or wears closed toe shoes, was seeing podiatry in [place] and they recommended her to come here.    HPI: 78 y.o. female presenting today as a new patient referral from podiatry in [place], [place] for evaluation and surgical consultation of hallux rigidus to the left foot.  Patient is been diagnosed with arthritis to the first MTP of the left foot.  This has been progressive the getting more painful over the past several years.  She has tried different shoe gear modifications and anti-inflammatories with no improvement or relief of symptoms.  Past Medical History:  Diagnosis Date   Cirrhosis (HCC)    COPD (chronic obstructive pulmonary disease) (HCC) 06/02/2016   Food poisoning 2019   AT [facility]   GERD (gastroesophageal reflux disease)    Headache    History of cardiac catheterization    Normal coronary arteries 2009   Hyperlipidemia    Middle cerebral artery aneurysm    Left - [facility] 2003   Primary localized osteoarthritis of left knee 03/08/2019    Past Surgical History:  Procedure Laterality Date   ABDOMINAL HYSTERECTOMY     1 OVARY LEFT   BIOPSY  07/02/2017   Procedure: BIOPSY;  Surgeon: Golda Claudis PENNER, MD;  [facility];  Service: Endoscopy;;  colon   BREAST SURGERY  1993   REDUCTION    CATARACT EXTRACTION W/PHACO Right 02/01/2014   Procedure: CATARACT EXTRACTION PHACO AND INTRAOCULAR LENS PLACEMENT (IOC);  Surgeon: Cherene Mania, MD;  Location: [facility];  Service: Ophthalmology;  Laterality: Right;  CDE 7.59   CATARACT EXTRACTION W/PHACO Left 02/22/2014   Procedure: CATARACT EXTRACTION PHACO AND INTRAOCULAR LENS PLACEMENT (IOC);  Surgeon: Cherene Mania, MD;  [facility]: [facility];  Service: Ophthalmology;  Laterality: Left;  CDE 8.12   Cerebral aneurysm surgery     2003 at [facility].    CHOLECYSTECTOMY  1995    COLONOSCOPY N/A 07/02/2017   Procedure: COLONOSCOPY;  Surgeon: Golda Claudis PENNER, MD;  [facility];  Service: Endoscopy;  Laterality: N/A;  7:30   COLONOSCOPY N/A 08/29/2020   Procedure: COLONOSCOPY;  Surgeon: Golda Claudis PENNER, MD;  [facility];  Service: Endoscopy;  Laterality: N/A;  8:15   POLYPECTOMY  07/02/2017   Procedure: POLYPECTOMY;  Surgeon: Golda Claudis PENNER, MD;  [facility];  Service: Endoscopy;;  colon   POLYPECTOMY  08/29/2020   Procedure: POLYPECTOMY;  Surgeon: Golda Claudis PENNER, MD;  [facility];  Service: Endoscopy;;   TOTAL KNEE ARTHROPLASTY Left 03/20/2019   Procedure: TOTAL KNEE ARTHROPLASTY;  Surgeon: Jane [place], MD;  [facility];  Service: Orthopedics;  Laterality: Left;    No Known Allergies   Physical Exam: General: The patient is alert and oriented x3 in no acute distress.  Dermatology: Skin is warm, dry and supple bilateral lower extremities.   Vascular: Palpable pedal pulses bilaterally. Capillary refill within normal limits.  No appreciable edema.  No erythema.  Neurological: Grossly intact via light touch  Musculoskeletal Exam: Tenderness with palpation and limited range of motion noted of the first MTP consistent with advanced arthritic change/hallux rigidus  Radiographic Exam LT foot 02/02/2024:  Degenerative changes noted to the first MTP with joint space narrowing consistent with advanced arthritis.  No acute fractures  identified.  Assessment/Plan of Care: 1.  Hallux rigidus first MTP left foot  -Patient evaluated.  X-rays reviewed -The patient has had this pain progressively for several years now.  She has seen other physicians and referred here for surgical consultation.  Unfortunately conservative management has failed to provide any sort of lasting alleviation of symptoms for the patient.  I do believe that surgery is in the best benefit of the patient and appropriate at this time -Today we discussed  arthrodesis versus arthroplasty with implant.  Risks and benefits associated to each were explained in length in detail.  Patient opts for arthroplasty with implant.  I do believe this is the best option for the patient to maintain mobility to the first MTP.  Risk benefits advantages and disadvantages of the procedure were explained in detail in length.  All possible complications and details of the procedure were also explained.  No guarantees were expressed or implied.  All patient questions were answered -Authorization for surgery was initiated today.  Surgery will consist of left great toe arthroplasty with double stem Silastic implant -Return to clinic 1 week postop       Thresa EMERSON Sar, DPM [facility]  Dr. Thresa EMERSON Sar, DPM    [address] [place], [place] [address]                Office [phone]  Fax [phone]

## 2024-02-10 ENCOUNTER — Telehealth: Payer: Self-pay | Admitting: Podiatry

## 2024-02-10 NOTE — Telephone Encounter (Signed)
 DOS- 02/24/2024  GREAT TOE KELLER BUNION IMPLANT LT- 28291  [employer] EFFECTIVE DATE- 06/30/2023  DEDUCTIBLE- $0 REMAINING- $0 OOP- $3900 REMAINING- $3379.01 COINSURANCE- 0%/$200 COPAY  [employer] EFFECTIVE DATE- 06/30/2023  PER [employer] WEBSITE, NO PRIOR AUTH IS REQUIRED FOR CPT CODE 71708. DECISION ID# [id number] DOCUMENTATION ATTACHED TO SURGERY CONSENT PACKET.

## 2024-02-24 ENCOUNTER — Other Ambulatory Visit: Payer: Self-pay | Admitting: Podiatry

## 2024-02-24 ENCOUNTER — Other Ambulatory Visit: Payer: Self-pay | Admitting: Internal Medicine

## 2024-02-24 DIAGNOSIS — M19071 Primary osteoarthritis, right ankle and foot: Secondary | ICD-10-CM | POA: Diagnosis not present

## 2024-02-24 DIAGNOSIS — M2021 Hallux rigidus, right foot: Secondary | ICD-10-CM | POA: Diagnosis not present

## 2024-02-24 DIAGNOSIS — M2022 Hallux rigidus, left foot: Secondary | ICD-10-CM | POA: Diagnosis not present

## 2024-02-24 MED ORDER — OXYCODONE-ACETAMINOPHEN 5-325 MG PO TABS: 1.0000 | ORAL_TABLET | ORAL | 0 refills | Status: DC | PRN

## 2024-02-24 MED ORDER — IBUPROFEN 800 MG PO TABS: 800.0000 mg | ORAL_TABLET | Freq: Three times a day (TID) | ORAL | 1 refills | Status: DC

## 2024-02-24 NOTE — Progress Notes (Signed)
 PRN postop

## 2024-03-01 ENCOUNTER — Ambulatory Visit: Admitting: Podiatry

## 2024-03-01 ENCOUNTER — Encounter: Payer: Self-pay | Admitting: Podiatry

## 2024-03-01 ENCOUNTER — Ambulatory Visit (INDEPENDENT_AMBULATORY_CARE_PROVIDER_SITE_OTHER)

## 2024-03-01 VITALS — Ht 61.0 in | Wt 184.0 lb

## 2024-03-01 DIAGNOSIS — M2022 Hallux rigidus, left foot: Secondary | ICD-10-CM

## 2024-03-01 MED ORDER — DOXYCYCLINE HYCLATE 100 MG PO TABS: 100.0000 mg | ORAL_TABLET | Freq: Two times a day (BID) | ORAL | 0 refills | Status: AC

## 2024-03-01 NOTE — Progress Notes (Signed)
 Chief Complaint  Patient presents with   Routine Post Op    POV # 1 DOS 02/24/24 LT GREAT TOE ARTHROPLASTY W/ IMPLANT, states the pain level without meds is between 7-9.     Subjective:  Patient presents today status post left great toe arthroplasty with Silastic implant.  DOS: 02/24/2024.  Doing well.  Pain is controlled with her pain medication  Past Medical History:  Diagnosis Date   Cirrhosis (HCC)    COPD (chronic obstructive pulmonary disease) (HCC) 06/02/2016   Food poisoning 2019   AT [facility]   GERD (gastroesophageal reflux disease)    Headache    History of cardiac catheterization    Normal coronary arteries 2009   Hyperlipidemia    Middle cerebral artery aneurysm    Left - [facility] 2003   Primary localized osteoarthritis of left knee 03/08/2019    Past Surgical History:  Procedure Laterality Date   ABDOMINAL HYSTERECTOMY     1 OVARY LEFT   BIOPSY  07/02/2017   Procedure: BIOPSY;  Surgeon: Golda Claudis PENNER, MD;  [facility];  Service: Endoscopy;;  colon   BREAST SURGERY  1993   REDUCTION    CATARACT EXTRACTION W/PHACO Right 02/01/2014   Procedure: CATARACT EXTRACTION PHACO AND INTRAOCULAR LENS PLACEMENT (IOC);  Surgeon: Cherene Mania, MD;  [facility];  Service: Ophthalmology;  Laterality: Right;  CDE 7.59   CATARACT EXTRACTION W/PHACO Left 02/22/2014   Procedure: CATARACT EXTRACTION PHACO AND INTRAOCULAR LENS PLACEMENT (IOC);  Surgeon: Cherene Mania, MD;  [facility];  Service: Ophthalmology;  Laterality: Left;  CDE 8.12   Cerebral aneurysm surgery     2003 at [facility].    CHOLECYSTECTOMY  1995   COLONOSCOPY N/A 07/02/2017   Procedure: COLONOSCOPY;  Surgeon: Golda Claudis PENNER, MD;  [facility];  Service: Endoscopy;  Laterality: N/A;  7:30   COLONOSCOPY N/A 08/29/2020   Procedure: COLONOSCOPY;  Surgeon: Golda Claudis PENNER, MD;  [facility];  Service: Endoscopy;  Laterality: N/A;  8:15   POLYPECTOMY  07/02/2017   Procedure:  POLYPECTOMY;  Surgeon: Golda Claudis PENNER, MD;  [facility];  Service: Endoscopy;;  colon   POLYPECTOMY  08/29/2020   Procedure: POLYPECTOMY;  Surgeon: Golda Claudis PENNER, MD;  [facility];  Service: Endoscopy;;   TOTAL KNEE ARTHROPLASTY Left 03/20/2019   Procedure: TOTAL KNEE ARTHROPLASTY;  Surgeon: Jane [place], MD;  [facility];  Service: Orthopedics;  Laterality: Left;    No Known Allergies  Objective/Physical Exam Neurovascular status intact.  Incision well coapted with staples intact. No sign of infectious process noted however there is some slight pink erythema localized around the incision site. No dehiscence. No active bleeding noted.  Moderate edema noted to the surgical extremity.  Radiographic Exam LT foot 03/01/2024:  Silastic implant and osteotomies sites appear to be stable with routine healing.  Assessment: 1. s/p left great toe arthroplasty with implant. DOS: 02/24/2024   Plan of Care:  -Patient was evaluated. X-rays reviewed -Dressings changed.  Been clean dry and intact x 1 week - Prescription for doxycycline  100 mg twice daily x 7 days to ensure no cellulitis -Continue WBAT surgical shoe -Return to clinic 1 week   Thresa EMERSON Sar, DPM [facility]  Dr. Thresa EMERSON Sar, DPM    [address]  [place], [place] [address]                Office [phone]  Fax [phone]     No earlier appointment so

## 2024-03-02 ENCOUNTER — Telehealth: Payer: Self-pay | Admitting: Podiatry

## 2024-03-02 ENCOUNTER — Other Ambulatory Visit: Payer: Self-pay | Admitting: Podiatry

## 2024-03-02 MED ORDER — OXYCODONE-ACETAMINOPHEN 5-325 MG PO TABS: 1.0000 | ORAL_TABLET | ORAL | 0 refills | Status: AC | PRN

## 2024-03-02 NOTE — Telephone Encounter (Signed)
 Pt States that she needs a refill for her pain medication Ocxycodone

## 2024-03-08 ENCOUNTER — Ambulatory Visit (INDEPENDENT_AMBULATORY_CARE_PROVIDER_SITE_OTHER): Admitting: Podiatry

## 2024-03-08 DIAGNOSIS — M2022 Hallux rigidus, left foot: Secondary | ICD-10-CM

## 2024-03-08 NOTE — Progress Notes (Signed)
 Chief Complaint  Patient presents with   Post-op Follow-up    LT Great Toe Arthroplasty w/ implant DOS 02/24/24. POV 2  Wearing surgical shoe today. WB. Pain level today 3/10 at most. Takes ibuprofen  for pain control. Has two doses left of doxycycline . Calf is red and swollen with tight feeling but no pain. No anticoag. No dressing changes since last visit. Has been keeping it dry when showering. Dressing is clean dry and intact. Surgical site shows no dehiscence, staples intact.     Subjective:  Patient presents today status post left great toe arthroplasty with Silastic implant.  DOS: 02/24/2024.  Doing well.  Currently not taking any pain medication  Past Medical History:  Diagnosis Date   Cirrhosis (HCC)    COPD (chronic obstructive pulmonary disease) (HCC) 06/02/2016   Food poisoning 2019   AT [facility]   GERD (gastroesophageal reflux disease)    Headache    History of cardiac catheterization    Normal coronary arteries 2009   Hyperlipidemia    Middle cerebral artery aneurysm    Left - [facility] 2003   Primary localized osteoarthritis of left knee 03/08/2019    Past Surgical History:  Procedure Laterality Date   ABDOMINAL HYSTERECTOMY     1 OVARY LEFT   BIOPSY  07/02/2017   Procedure: BIOPSY;  Surgeon: Golda Claudis PENNER, MD;  [facility];  Service: Endoscopy;;  colon   BREAST SURGERY  1993   REDUCTION    CATARACT EXTRACTION W/PHACO Right 02/01/2014   Procedure: CATARACT EXTRACTION PHACO AND INTRAOCULAR LENS PLACEMENT (IOC);  Surgeon: Cherene Mania, MD;  [facility];  Service: Ophthalmology;  Laterality: Right;  CDE 7.59   CATARACT EXTRACTION W/PHACO Left 02/22/2014   Procedure: CATARACT EXTRACTION PHACO AND INTRAOCULAR LENS PLACEMENT (IOC);  Surgeon: Cherene Mania, MD;  Location: [facility];  Service: Ophthalmology;  Laterality: Left;  CDE 8.12   Cerebral aneurysm surgery     2003 at [facility].    CHOLECYSTECTOMY  1995   COLONOSCOPY N/A 07/02/2017   Procedure:  COLONOSCOPY;  Surgeon: Golda Claudis PENNER, MD;  [facility];  Service: Endoscopy;  Laterality: N/A;  7:30   COLONOSCOPY N/A 08/29/2020   Procedure: COLONOSCOPY;  Surgeon: Golda Claudis PENNER, MD;  [facility];  Service: Endoscopy;  Laterality: N/A;  8:15   POLYPECTOMY  07/02/2017   Procedure: POLYPECTOMY;  Surgeon: Golda Claudis PENNER, MD;  [facility];  Service: Endoscopy;;  colon   POLYPECTOMY  08/29/2020   Procedure: POLYPECTOMY;  Surgeon: Golda Claudis PENNER, MD;  [facility];  Service: Endoscopy;;   TOTAL KNEE ARTHROPLASTY Left 03/20/2019   Procedure: TOTAL KNEE ARTHROPLASTY;  Surgeon: Jane [place], MD;  [facility];  Service: Orthopedics;  Laterality: Left;    No Known Allergies  Objective/Physical Exam Neurovascular status intact.  Incision well coapted with staples intact. No sign of infectious process noted.  The erythema and edema around the leg has improved.  Chronic edema noted bilateral lower extremities  Radiographic Exam LT foot 03/01/2024:  Silastic implant and osteotomies sites appear to be stable with routine healing.  Assessment: 1. s/p left great toe arthroplasty with implant. DOS: 02/24/2024   Plan of Care:  -Patient was evaluated. -Staples removed -Okay to slowly transition out of the postop shoe will obtain shoes and sneakers -Return to clinic 2 weeks follow-up x-ray   Thresa EMERSON Sar, DPM [facility]  Dr. Thresa EMERSON Sar, DPM  [address] [place] [address]                Office [phone]  Fax [phone]     No earlier appointment so

## 2024-03-22 ENCOUNTER — Ambulatory Visit (INDEPENDENT_AMBULATORY_CARE_PROVIDER_SITE_OTHER): Admitting: Podiatry

## 2024-03-22 ENCOUNTER — Ambulatory Visit (INDEPENDENT_AMBULATORY_CARE_PROVIDER_SITE_OTHER)

## 2024-03-22 ENCOUNTER — Encounter: Payer: Self-pay | Admitting: Podiatry

## 2024-03-22 VITALS — Ht 61.0 in | Wt 184.0 lb

## 2024-03-22 DIAGNOSIS — M2022 Hallux rigidus, left foot: Secondary | ICD-10-CM

## 2024-03-23 NOTE — Progress Notes (Signed)
 Chief Complaint  Patient presents with   Routine Post Op     POV # 3 DOS 02/24/24 LT GREAT TOE ARTHROPLASTY W/ IMPLANT, pt is here to f/u on left foot she states that everything is going well, still some soreness when wearing close toe shoes, no other complaints.     Subjective:  Patient presents today status post left great toe arthroplasty with Silastic implant.  DOS: 02/24/2024.  Patient continues to do well.  She is ambulating in tennis shoes.  No new complaints  Past Medical History:  Diagnosis Date   Cirrhosis (HCC)    COPD (chronic obstructive pulmonary disease) (HCC) 06/02/2016   Food poisoning 2019   AT [facility]   GERD (gastroesophageal reflux disease)    Headache    History of cardiac catheterization    Normal coronary arteries 2009   Hyperlipidemia    Middle cerebral artery aneurysm    Left - [facility] 2003   Primary localized osteoarthritis of left knee 03/08/2019    Past Surgical History:  Procedure Laterality Date   ABDOMINAL HYSTERECTOMY     1 OVARY LEFT   BIOPSY  07/02/2017   Procedure: BIOPSY;  Surgeon: Golda Claudis PENNER, MD;  [facility];  Service: Endoscopy;;  colon   BREAST SURGERY  1993   REDUCTION    CATARACT EXTRACTION W/PHACO Right 02/01/2014   Procedure: CATARACT EXTRACTION PHACO AND INTRAOCULAR LENS PLACEMENT (IOC);  Surgeon: Cherene Mania, MD;  Location: [facility];  Service: Ophthalmology;  Laterality: Right;  CDE 7.59   CATARACT EXTRACTION W/PHACO Left 02/22/2014   Procedure: CATARACT EXTRACTION PHACO AND INTRAOCULAR LENS PLACEMENT (IOC);  Surgeon: Cherene Mania, MD;  Location: [facility];  Service: Ophthalmology;  Laterality: Left;  CDE 8.12   Cerebral aneurysm surgery     2003 at [facility].    CHOLECYSTECTOMY  1995   COLONOSCOPY N/A 07/02/2017   Procedure: COLONOSCOPY;  Surgeon: Golda Claudis PENNER, MD;  [facility];  Service: Endoscopy;  Laterality: N/A;  7:30   COLONOSCOPY N/A 08/29/2020   Procedure: COLONOSCOPY;  Surgeon: Golda Claudis PENNER, MD;  [facility];  Service: Endoscopy;  Laterality: N/A;  8:15   POLYPECTOMY  07/02/2017   Procedure: POLYPECTOMY;  Surgeon: Golda Claudis PENNER, MD;  [facility];  Service: Endoscopy;;  colon   POLYPECTOMY  08/29/2020   Procedure: POLYPECTOMY;  Surgeon: Golda Claudis PENNER, MD;  [facility];  Service: Endoscopy;;   TOTAL KNEE ARTHROPLASTY Left 03/20/2019   Procedure: TOTAL KNEE ARTHROPLASTY;  Surgeon: Jane [place], MD;  [facility];  Service: Orthopedics;  Laterality: Left;    No Known Allergies  Objective/Physical Exam Neurovascular status intact.  Incision nicely healed.  No erythema or edema around the toe.  No tenderness with palpation.  Good range of motion of the first MTP  Radiographic Exam LT foot 03/23/2024:  Silastic implant and osteotomies sites appear to be stable with routine healing.  Assessment: 1. s/p left great toe arthroplasty with implant. DOS: 02/24/2024   Plan of Care:  -Patient was evaluated. - Continue good supportive tennis shoes and sneakers.  May slowly increase to full activity no restrictions over the following 3-4 weeks as tolerated -Will sign off.  Return to clinic PRN   Thresa EMERSON Sar, DPM [facility]  Dr. Thresa EMERSON Sar, DPM    [address]  [place], [place] [address]                Office [phone]  Fax [phone]     No earlier appointment so

## 2024-04-05 DIAGNOSIS — Z1231 Encounter for screening mammogram for malignant neoplasm of breast: Secondary | ICD-10-CM | POA: Diagnosis not present

## 2024-04-06 ENCOUNTER — Other Ambulatory Visit: Payer: Self-pay | Admitting: Podiatry
